# Patient Record
Sex: Male | Born: 1965 | Race: Black or African American | Hispanic: No | Marital: Single | Smoking: Current every day smoker
Health system: Southern US, Community
[De-identification: ages and names within clinical notes are randomized; demographics above are authoritative.]

## PROBLEM LIST (undated history)

## (undated) DIAGNOSIS — I10 Essential (primary) hypertension: Secondary | ICD-10-CM

## (undated) HISTORY — PX: KNEE ARTHROSCOPY: SHX127

---

## 2010-10-26 ENCOUNTER — Emergency Department (HOSPITAL_COMMUNITY)
Admission: EM | Admit: 2010-10-26 | Discharge: 2010-10-26 | Disposition: A | Discharge status: Home / Self Care | Payer: Self-pay | Attending: Emergency Medicine | Admitting: Emergency Medicine

## 2010-10-26 DIAGNOSIS — I10 Essential (primary) hypertension: Secondary | ICD-10-CM | POA: Insufficient documentation

## 2010-10-26 LAB — CBC
Hemoglobin: 14.5 g/dL (ref 13.0–17.0)
MCH: 32.6 pg (ref 26.0–34.0)
MCV: 92.4 fL (ref 78.0–100.0)
RBC: 4.45 MIL/uL (ref 4.22–5.81)

## 2010-10-26 LAB — COMPREHENSIVE METABOLIC PANEL
CO2: 27 mEq/L (ref 19–32)
Calcium: 9.3 mg/dL (ref 8.4–10.5)
Creatinine, Ser: 0.82 mg/dL (ref 0.50–1.35)
GFR calc Af Amer: >60 mL/min (ref >60)
GFR calc non Af Amer: >60 mL/min (ref >60)
Glucose, Bld: 102 mg/dL — ABNORMAL HIGH (ref 70–99)

## 2010-10-26 LAB — DIFFERENTIAL
Eosinophils Absolute: 0.3 10*3/uL (ref 0.0–0.7)
Lymphs Abs: 1.9 10*3/uL (ref 0.7–4.0)
Monocytes Absolute: 0.4 10*3/uL (ref 0.1–1.0)
Monocytes Relative: 10 % (ref 3–12)
Neutrophils Relative %: 40 % — ABNORMAL LOW (ref 43–77)

## 2010-10-30 ENCOUNTER — Inpatient Hospital Stay (INDEPENDENT_AMBULATORY_CARE_PROVIDER_SITE_OTHER)
Admission: RE | Admit: 2010-10-30 | Discharge: 2010-10-30 | Disposition: A | Discharge status: Home / Self Care | Payer: Self-pay | Source: Ambulatory Visit | Attending: Family Medicine | Admitting: Family Medicine

## 2010-10-30 DIAGNOSIS — I10 Essential (primary) hypertension: Secondary | ICD-10-CM

## 2011-06-01 ENCOUNTER — Emergency Department (HOSPITAL_COMMUNITY)
Admission: EM | Admit: 2011-06-01 | Discharge: 2011-06-01 | Disposition: A | Discharge status: Home / Self Care | Payer: BC Managed Care – PPO | Attending: Emergency Medicine | Admitting: Emergency Medicine

## 2011-06-01 ENCOUNTER — Encounter (HOSPITAL_COMMUNITY): Payer: Self-pay | Admitting: *Deleted

## 2011-06-01 DIAGNOSIS — R5381 Other malaise: Secondary | ICD-10-CM | POA: Insufficient documentation

## 2011-06-01 DIAGNOSIS — S01511A Laceration without foreign body of lip, initial encounter: Secondary | ICD-10-CM

## 2011-06-01 DIAGNOSIS — I10 Essential (primary) hypertension: Secondary | ICD-10-CM

## 2011-06-01 DIAGNOSIS — R55 Syncope and collapse: Secondary | ICD-10-CM

## 2011-06-01 DIAGNOSIS — Z7982 Long term (current) use of aspirin: Secondary | ICD-10-CM | POA: Insufficient documentation

## 2011-06-01 DIAGNOSIS — X58XXXA Exposure to other specified factors, initial encounter: Secondary | ICD-10-CM | POA: Insufficient documentation

## 2011-06-01 DIAGNOSIS — S01501A Unspecified open wound of lip, initial encounter: Secondary | ICD-10-CM | POA: Insufficient documentation

## 2011-06-01 DIAGNOSIS — Y9229 Other specified public building as the place of occurrence of the external cause: Secondary | ICD-10-CM | POA: Insufficient documentation

## 2011-06-01 DIAGNOSIS — R42 Dizziness and giddiness: Secondary | ICD-10-CM | POA: Insufficient documentation

## 2011-06-01 DIAGNOSIS — F17200 Nicotine dependence, unspecified, uncomplicated: Secondary | ICD-10-CM | POA: Insufficient documentation

## 2011-06-01 DIAGNOSIS — F1010 Alcohol abuse, uncomplicated: Secondary | ICD-10-CM | POA: Insufficient documentation

## 2011-06-01 HISTORY — DX: Essential (primary) hypertension: I10

## 2011-06-01 MED ORDER — AMOXICILLIN-POT CLAVULANATE 875-125 MG PO TABS: 1.0000 | ORAL_TABLET | Freq: Two times a day (BID) | ORAL | Status: DC

## 2011-06-01 MED ORDER — SODIUM CHLORIDE 0.9 % IV BOLUS (SEPSIS): 1000.0000 mL | Freq: Once | INTRAVENOUS | Status: DC

## 2011-06-01 MED ORDER — TETANUS-DIPHTH-ACELL PERTUSSIS 5-2.5-18.5 LF-MCG/0.5 IM SUSP
0.5000 mL | Freq: Once | INTRAMUSCULAR | Status: AC
  Administered 2011-06-01: 0.5 mL via INTRAMUSCULAR
  Filled 2011-06-01: qty 0.5

## 2011-06-01 NOTE — ED Notes (Signed)
Discharged with instructions NAD noted.

## 2011-06-01 NOTE — Discharge Instructions (Signed)
Follow up with your doctor, an urgent care, or this Emergency Department for removal of your stitches in 7 days. Do not submerge the stitches in water for the first 24 hours. Take your pain medication as prescribed. Do not operate heavy machinery while on pain medication. Note that your pain medication contains acetaminophen (Tylenol) & its is not reccommended that you use additional acetaminophen (Tylenol) while taking this medication. Read instructions below.  TREATMENT   Keep the wound clean and dry.   If you were given a bandage (dressing), you should change it at least once a day. Also, change the dressing if it becomes wet or dirty, or as directed by your caregiver.   Wash the wound with soap and water 2 times a day. Rinse the wound off with water to remove all soap. Pat the wound dry with a clean towel.   You may shower as usual after the first 24 hours. Do not soak the wound in water until the sutures are removed.   Once the wound has healed, scarring can be minimized by covering the wound with sunscreen during the day for 1 full year.Marland Kitchen   SEEK MEDICAL CARE IF:   You have redness, swelling, or increasing pain in the wound.   You see a red line that goes away from the wound.   You have yellowish-white fluid (pus) coming from the wound.   You have a fever.   You notice a bad smell coming from the wound or dressing.   Your wound breaks open before or after sutures have been removed.   You notice something coming out of the wound such as wood or glass.   Your wound is on your hand or foot and you cannot move a finger or toe.   Your pain is not controlled with prescribed medicine.   If you did not receive a tetanus shot today because you thought you were up to date, but did not recall when your last one was given, make sure to check with your primary caregiver to determine if you need one.  Human Bite Human bite wounds tend to become infected, even when they seem minor at  first. Bite wounds of the hand can be serious because the tendons and joints are close to the skin. Infection can develop very rapidly, even in a matter of hours.  DIAGNOSIS  Your caregiver will most likely:  Take a detailed history of the bite injury.   Perform a wound exam.   Take your medical history.  Blood tests or X-rays may be performed. Sometimes, infected bite wounds are cultured and sent to a lab to identify the infectious bacteria. TREATMENT  Medical treatment will depend on the location of the bite as well as the patient's medical history. Treatment may include:  Wound care, such as cleaning and flushing the wound with saline solution, bandaging, and elevating the affected area.   Antibiotic medicine.   Tetanus immunization.   Leaving the wound open to heal. This is often done with human bites due to the high risk of infection. However, in certain cases, wound closure with stitches, wound adhesive, skin adhesive strips, or staples may be used.  Infected bites that are left untreated may require intravenous (IV) antibiotics and surgical treatment in the hospital. HOME CARE INSTRUCTIONS  Follow your caregiver's instructions for wound care.   Take all medicines as directed.   If your caregiver prescribes antibiotics, take them as directed. Finish them even if you start to feel  better.   Follow up with your caregiver for further exams or immunizations as directed.  You may need a tetanus shot if:  You cannot remember when you had your last tetanus shot.   You have never had a tetanus shot.   The injury broke your skin.  If you get a tetanus shot, your arm may swell, get red, and feel warm to the touch. This is common and not a problem. If you need a tetanus shot and you choose not to have one, there is a rare chance of getting tetanus. Sickness from tetanus can be serious. SEEK IMMEDIATE MEDICAL CARE IF:  You have increased pain, swelling, or redness around the bite  wound.   You have chills.   You have a fever.   You have pus draining from the wound.   You have red streaks on the skin coming from the wound.   You have pain with movement or trouble moving the injured part.   You are not improving, or you are getting worse.   You have any other questions or concerns.  MAKE SURE YOU:  Understand these instructions.   Will watch your condition.   Will get help right away if you are not doing well or get worse.  Document Released: 03/14/2004 Document Revised: 01/24/2011 Document Reviewed: 09/26/2010 [facility] Patient Information 2012 [place], [place]. RESOURCE GUIDE  Dental Problems  Patients with Medicaid: [facility] [phone] [address]                                           [phone] [address] Phone:  [phone]                                                  Phone:  [phone]  If unable to pay or uninsured, contact:  [facility] or [facility]. to become qualified for the adult dental clinic.  Chronic Pain Problems Contact [facility]  [phone] Patients need to be referred by their primary care doctor.  Insufficient Money for Medicine Contact [employer]:  call "211" or [facility] (631)549-0835.  No Primary Care Doctor Call [facility]  ([phone] Other agencies that provide inexpensive medical care    [facility]  7800277404    [facility]  [phone]    [facility]  [phone]    [facility]  [phone]    [facility]  [phone]    [facility]  [phone]  Psychological Services [facility]  [phone] [facility]  [phone] [facility]   [phone] (emergency services 5627402001)  Substance Abuse [facility]  727-708-7626 [facility] 806-215-9740 [facility] [phone] Floyd[facility] Flock  ([phone] Residential & Outpatient Substance Abuse Program  437-458-6875  Abuse/Neglect [facility] Child Abuse Hotline (817)337-3254 [facility] Child Abuse Hotline 8380168779 (After Hours)  Emergency Shelter [facility] 336-730-7577  Maternity Homes [facility] [place] [facility] [phone] [facility] [phone]  MRSA Hotline #:  832-7006    [place] Resources  [facility] [place]     [employer]                          [place] [facility] [address] [place]                       [address]      [address]  [place]                                                [place]                            [place] Phone:  [phone]                                   Phone:  [phone]                 Phone:  [phone]  [place] [facility] Phone:  [phone]  [place] Child Abuse Hotline (336) 342-1394 ([phone] (After Hours)   

## 2011-06-01 NOTE — ED Notes (Signed)
Pt got excited & bit down on lower lip. Bleeding presently, currently holding pressure

## 2011-06-01 NOTE — ED Notes (Signed)
+   ETOH, reports drank 3 forties PTA

## 2011-06-01 NOTE — ED Provider Notes (Signed)
History     CSN: [id number]  Arrival date & time 06/01/11  1338   First MD Initiated Contact with Patient 06/01/11 1348      Chief Complaint  Patient presents with  . Weakness    (Consider location/radiation/quality/duration/timing/severity/associated sxs/prior treatment) Patient is a 46 y.o. male presenting with weakness. The history is provided by the patient.  Weakness The primary symptoms include dizziness. Primary symptoms do not include nausea or vomiting.  Dizziness also occurs with weakness. Dizziness does not occur with nausea or vomiting.  Additional symptoms include weakness.   patient was just seen in ER for alcohol intoxication and lip injury after he bit down. He went outside to smoke after being discharged and felt lightheaded and came back. Blood pressure was found to be 92/52. Patient states his he thinks it is because he lost some blood. He feels better now. No headache. No chest pain. No abdominal pain.  Past Medical History  Diagnosis Date  . Hypertension     History reviewed. No pertinent past surgical history.  No family history on file.  History  Substance Use Topics  . Smoking status: Current Everyday Smoker -- 0.5 packs/day    Types: Cigarettes  . Smokeless tobacco: Not on file  . Alcohol Use: Yes     multiple 40s a day      Review of Systems  HENT: Negative for sore throat and voice change.   Respiratory: Negative for shortness of breath.   Gastrointestinal: Negative for nausea, vomiting and abdominal pain.  Skin: Negative for pallor and rash.  Neurological: Positive for dizziness and weakness.    Allergies  Review of patient's allergies indicates not on file.  Home Medications   Current Outpatient Rx  Name Route Sig Dispense Refill  . AMOXICILLIN-POT CLAVULANATE 875-125 MG PO TABS Oral Take 1 tablet by mouth 2 (two) times daily. 10 tablet 0  . ASPIRIN 325 MG PO TABS Oral Take 325 mg by mouth daily.      BP 92/52  Pulse 76   Temp(Src) 99.6 F (37.6 C) (Oral)  Resp 16  SpO2 97%  Physical Exam  Constitutional: He is oriented to person, place, and time. He appears well-developed.  HENT:  Head: Normocephalic.       Swelling to right lower lip. Mild oozing of blood  Eyes: Conjunctivae are normal. Pupils are equal, round, and reactive to light.  Cardiovascular: Normal rate and regular rhythm.   Pulmonary/Chest: Effort normal. No respiratory distress.  Abdominal: Soft. There is no tenderness.  Musculoskeletal: Normal range of motion.  Neurological: He is alert and oriented to person, place, and time.       Patient spells of alcohol    ED Course  Procedures (including critical care time)  Labs Reviewed - No data to display No results found.   1. Near syncope       MDM  Patient with near syncope after leaving the ER. Appears intoxicated. No trauma from his dizziness. Blood pressure was initially low, but improved. He does not want any further evaluation and will be discharged home.        Juliet Rude. Rubin Payor, MD 06/01/11 1404

## 2011-06-01 NOTE — ED Provider Notes (Deleted)
  Physical Exam  BP 92/52  Pulse 76  Temp(Src) 99.6 F (37.6 C) (Oral)  Resp 16  SpO2 97%  Physical Exam  Patient is hypotensive. He does not appear dizzy. He was ambulatory here. Laceration to right lower lip. He smells of alcohol  ED Course  Procedures  MDM Patient was just discharged after a lip injury. He states he bit down. He was outside smoking a cigarette and became lightheaded and dizzy. No chest pain. Abdominal pain. No headache. Initial blood pressure is 92/52. He smells of alcohol. Patient given a fluid boluses and lab work and EKG will be checked at this time      Juliet Rude. Rubin Payor, MD 06/01/11 1352

## 2011-06-01 NOTE — ED Provider Notes (Signed)
History     CSN: [id number]  Arrival date & time 06/01/11  0935   First MD Initiated Contact with Patient 06/01/11 1012      Chief Complaint  Patient presents with  . Lip Laceration    (Consider location/radiation/quality/duration/timing/severity/associated sxs/prior treatment) HPI Comments: Patient presents emergency department with chief complaint of lip laceration.  Patient states that he was drinking with friends and having a good time when he got excited and bit down on his lower lip.  Patient's lip has been continuously bleeding ever sense.  911 was called and patient was transferred.  Patient denies any other symptoms including fever, lightheadedness, nausea, vomiting, syncope.  Patient has no other complaints at this time.  The history is provided by the patient.    Past Medical History  Diagnosis Date  . Hypertension     History reviewed. No pertinent past surgical history.  No family history on file.  History  Substance Use Topics  . Smoking status: Current Everyday Smoker -- 0.5 packs/day    Types: Cigarettes  . Smokeless tobacco: Not on file  . Alcohol Use:       Review of Systems  Constitutional: Negative for fever, chills and appetite change.  HENT: Negative for congestion.   Eyes: Negative for visual disturbance.  Respiratory: Negative for shortness of breath.   Cardiovascular: Negative for chest pain and leg swelling.  Gastrointestinal: Negative for abdominal pain.  Genitourinary: Negative for dysuria, urgency and frequency.  Skin: Positive for wound.  Neurological: Negative for dizziness, syncope, weakness, light-headedness, numbness and headaches.  Psychiatric/Behavioral: Negative for confusion.    Allergies  Review of patient's allergies indicates not on file.  Home Medications   Current Outpatient Rx  Name Route Sig Dispense Refill  . ASPIRIN 325 MG PO TABS Oral Take 325 mg by mouth daily.      BP 156/95  Pulse 80  Temp(Src) 97.8 F  (36.6 C) (Oral)  Resp 20  SpO2 98%  Physical Exam  Nursing note and vitals reviewed. Constitutional: He is oriented to person, place, and time. He appears well-developed and well-nourished. No distress.       Smells of ETOH  HENT:  Head: Normocephalic.       Jagged lip puncture, currently bleeding.   Eyes: Conjunctivae and EOM are normal.  Neck: Normal range of motion.  Pulmonary/Chest: Effort normal.  Musculoskeletal: Normal range of motion.  Neurological: He is alert and oriented to person, place, and time.  Skin: Skin is warm and dry. No rash noted. He is not diaphoretic.  Psychiatric: He has a normal mood and affect. His behavior is normal.    ED Course  Procedures (including critical care time)  Labs Reviewed - No data to display No results found.   No diagnosis found.  LACERATION REPAIR Performed by: Jaci Carrel Authorized by: Jaci Carrel Consent: Verbal consent obtained. Risks and benefits: risks, benefits and alternatives were discussed Consent given by: patient Patient identity confirmed: provided demographic data Prepped and Draped in normal sterile fashion Wound explored  Laceration Location: right sd lower lip  Laceration Length: .5cm  No Foreign Bodies seen or palpated  Anesthesia: local infiltration  Local anesthetic: lidocaine 1% no epinephrine  Anesthetic total: 1 ml  Irrigation method: syringe Amount of cleaning: standard  Skin closure: 4.0 gut  Number of sutures: 3  Technique: figure eight & simple interupted  Patient tolerance: Patient tolerated the procedure well with no immediate complications.   MDM  Lip laceration, Hypertension  Tdap booster given.Pressure irrigation performed. Laceration occurred < 8 hours prior to repair which was well tolerated. Pt has no co morbidities to effect normal wound healing. Discussed suture home care w pt and answered questions. Pt to f-u for wound check 5 days. Pt is hemodynamically stable w no  complaints prior to dc.  Advised to follow up with PCP for HTN          Jaci Carrel, PA-C 06/01/11 1135

## 2011-06-01 NOTE — Discharge Instructions (Signed)
Near-Syncope Near-syncope is sudden weakness, dizziness, or feeling like you might pass out (faint). This may occur when getting up after sitting or while standing for a long period of time. Near-syncope can be caused by a drop in blood pressure. This is a common reaction, but it may occur to a greater degree in people taking medicines to control their blood pressure. Fainting often occurs when the blood pressure or pulse is too low to provide enough blood flow to the brain to keep you conscious. Fainting and near-syncope are not usually due to serious medical problems. However, certain people should be more cautious in the event of near-syncope, including elderly patients, patients with diabetes, and patients with a history of heart conditions (especially irregular rhythms).  CAUSES   Drop in blood pressure.   Physical pain.   Dehydration.   Heat exhaustion.   Emotional distress.   Low blood sugar.   Internal bleeding.   Heart and circulatory problems.   Infections.  SYMPTOMS   Dizziness.   Feeling sick to your stomach (nauseous).   Nearly fainting.   Body numbness.   Turning pale.   Tunnel vision.   Weakness.  HOME CARE INSTRUCTIONS   Lie down right away if you start feeling like you might faint. Breathe deeply and steadily. Wait until all the symptoms have passed. Most of these episodes last only a few minutes. You may feel tired for several hours.   Drink enough fluids to keep your urine clear or pale yellow.   If you are taking blood pressure or heart medicine, get up slowly, taking several minutes to sit and then stand. This can reduce dizziness that is caused by a drop in blood pressure.  SEEK IMMEDIATE MEDICAL CARE IF:   You have a severe headache.   Unusual pain develops in the chest, abdomen, or back.   There is bleeding from the mouth or rectum, or you have black or tarry stool.   An irregular heartbeat or a very rapid pulse develops.   You have  repeated fainting or seizure-like jerking during an episode.   You faint when sitting or lying down.   You develop confusion.   You have difficulty walking.   Severe weakness develops.   Vision problems develop.  MAKE SURE YOU:   Understand these instructions.   Will watch your condition.   Will get help right away if you are not doing well or get worse.  Document Released: 02/04/2005 Document Revised: 01/24/2011 Document Reviewed: 03/23/2010 [employer] Patient Information 2012 [employer]. 

## 2011-06-01 NOTE — ED Notes (Signed)
PT was observed out side on side walk just after his D/C from emergency room. Pt now reports he is weak . B/P is 92/52. Pt is A/O . Pt reports Lac on lower lip was present on recent ADM.

## 2011-06-04 NOTE — ED Provider Notes (Signed)
Medical screening examination/treatment/procedure(s) were performed by non-physician practitioner and as supervising physician I was immediately available for consultation/collaboration.   Jordyn Doane, MD 06/04/11 0549 

## 2011-06-07 ENCOUNTER — Encounter (HOSPITAL_COMMUNITY): Payer: Self-pay | Admitting: Physical Medicine and Rehabilitation

## 2011-06-07 ENCOUNTER — Emergency Department (HOSPITAL_COMMUNITY)
Admission: EM | Admit: 2011-06-07 | Discharge: 2011-06-07 | Disposition: A | Discharge status: Home / Self Care | Payer: BC Managed Care – PPO | Attending: Emergency Medicine | Admitting: Emergency Medicine

## 2011-06-07 DIAGNOSIS — I10 Essential (primary) hypertension: Secondary | ICD-10-CM | POA: Insufficient documentation

## 2011-06-07 DIAGNOSIS — F17200 Nicotine dependence, unspecified, uncomplicated: Secondary | ICD-10-CM | POA: Insufficient documentation

## 2011-06-07 DIAGNOSIS — Z4802 Encounter for removal of sutures: Secondary | ICD-10-CM | POA: Insufficient documentation

## 2011-06-07 NOTE — ED Provider Notes (Signed)
Medical screening examination/treatment/procedure(s) were performed by non-physician practitioner and as supervising physician I was immediately available for consultation/collaboration.  Jewelz Ricklefs, MD 06/07/11 1608 

## 2011-06-07 NOTE — ED Provider Notes (Signed)
History     CSN: [id number]  Arrival date & time 06/07/11  7829   First MD Initiated Contact with Patient 06/07/11 1018      Chief Complaint  Patient presents with  . Suture / Staple Removal    (Consider location/radiation/quality/duration/timing/severity/associated sxs/prior treatment) HPI Patient presents to the ER for re-evaluation of a lower lip wound and suture removal. The patient has had no complications since the wound was closed. The patient states that there have been no signs of infection. The patient is here for suture removal. Past Medical History  Diagnosis Date  . Hypertension     No past surgical history on file.  History reviewed. No pertinent family history.  History  Substance Use Topics  . Smoking status: Current Everyday Smoker -- 0.5 packs/day    Types: Cigarettes  . Smokeless tobacco: Not on file  . Alcohol Use: Yes     multiple 40s a day      Review of Systems All pertinent positives and negatives reviewed in the history of present illness  Allergies  Review of patient's allergies indicates no known allergies.  Home Medications   Current Outpatient Rx  Name Route Sig Dispense Refill  . AMOXICILLIN-POT CLAVULANATE 875-125 MG PO TABS Oral Take 1 tablet by mouth 2 (two) times daily. Started 4/16 for 10 days    . ASPIRIN 325 MG PO TABS Oral Take 325 mg by mouth as needed. For pain      BP 174/115  Pulse 80  Temp(Src) 98.1 F (36.7 C) (Oral)  Resp 18  SpO2 99%  Physical Exam  Nursing note and vitals reviewed. Constitutional: He appears well-developed and well-nourished.  HENT:       The patient has a healing laceration to his R lower lip. There is no purulent drainage or swelling.  Small piece of gut suture is exposed outside the wound   Eyes: Pupils are equal, round, and reactive to light.  Cardiovascular: Normal rate.   Pulmonary/Chest: Effort normal and breath sounds normal.  Skin: Skin is warm and dry.    ED Course    Procedures (including critical care time)  There is a small end of a gut suture out of the wound that I trimmed down.   MDM          Carlyle Dolly, PA-C 06/07/11 1105

## 2011-06-07 NOTE — ED Notes (Signed)
Pt d/c home in NAD. Pt voiced understanding of d/c instructions. Pt denies any pain at this time.

## 2011-06-07 NOTE — ED Notes (Signed)
Pt presents to department for evaluation of suture removal. States he had sutures placed on Friday to R lower lip. Here for follow up and removal. Site is yellow in color. Pt denies pain at the time. Has been taking antibiotics as prescribed. No signs of distress noted at the time.

## 2011-06-07 NOTE — Discharge Instructions (Signed)
Keep area clean and dry. Return here for any worsening in the area.

## 2011-10-04 ENCOUNTER — Encounter (HOSPITAL_COMMUNITY): Payer: Self-pay | Admitting: *Deleted

## 2011-10-04 ENCOUNTER — Emergency Department (HOSPITAL_COMMUNITY)
Admission: EM | Admit: 2011-10-04 | Discharge: 2011-10-04 | Disposition: A | Discharge status: Home / Self Care | Payer: BC Managed Care – PPO | Attending: Emergency Medicine | Admitting: Emergency Medicine

## 2011-10-04 DIAGNOSIS — L0390 Cellulitis, unspecified: Secondary | ICD-10-CM

## 2011-10-04 DIAGNOSIS — IMO0002 Reserved for concepts with insufficient information to code with codable children: Secondary | ICD-10-CM | POA: Insufficient documentation

## 2011-10-04 DIAGNOSIS — F17200 Nicotine dependence, unspecified, uncomplicated: Secondary | ICD-10-CM | POA: Insufficient documentation

## 2011-10-04 DIAGNOSIS — I10 Essential (primary) hypertension: Secondary | ICD-10-CM | POA: Insufficient documentation

## 2011-10-04 MED ORDER — HYDROCODONE-ACETAMINOPHEN 5-325 MG PO TABS: 1.0000 | ORAL_TABLET | Freq: Four times a day (QID) | ORAL | Status: DC | PRN

## 2011-10-04 NOTE — ED Provider Notes (Signed)
History     CSN: [id number]  Arrival date & time 10/04/11  7829   First MD Initiated Contact with Patient 10/04/11 [phone]      Chief Complaint  Patient presents with  . Abscess    (Consider location/radiation/quality/duration/timing/severity/associated sxs/prior treatment) Patient is a 46 y.o. male presenting with abscess. The history is provided by the patient.  Abscess  This is a new problem. The current episode started less than one week ago. The onset was gradual. The problem occurs rarely. The problem has been gradually worsening. The abscess is present on the left arm. The problem is moderate. The abscess is characterized by redness, painfulness and swelling. It is unknown what he was exposed to. The abscess first occurred at home. Pertinent negatives include no anorexia, no decrease in physical activity, not sleeping less, not drinking less, no fever, no fussiness, not sleeping more, no diarrhea, no vomiting, no congestion, no rhinorrhea, no sore throat, no decreased responsiveness and no cough. His past medical history does not include skin abscesses in family. There were no sick contacts. He has received no recent medical care.    Past Medical History  Diagnosis Date  . Hypertension     History reviewed. No pertinent past surgical history.  History reviewed. No pertinent family history.  History  Substance Use Topics  . Smoking status: Current Everyday Smoker -- 0.5 packs/day    Types: Cigarettes  . Smokeless tobacco: Not on file  . Alcohol Use: Yes     multiple 40s a day      Review of Systems  Constitutional: Positive for chills. Negative for fever, diaphoresis, activity change and decreased responsiveness.  HENT: Negative for congestion, sore throat, rhinorrhea and neck pain.   Respiratory: Negative for cough.   Gastrointestinal: Negative for vomiting, diarrhea and anorexia.  Genitourinary: Negative for dysuria.  Musculoskeletal: Negative for myalgias.  Skin:  Positive for color change (abcess w surrounding erythema). Negative for wound.  Neurological: Negative for headaches.  All other systems reviewed and are negative.    Allergies  Review of patient's allergies indicates no known allergies.  Home Medications   Current Outpatient Rx  Name Route Sig Dispense Refill  . ASPIRIN 325 MG PO TABS Oral Take 325 mg by mouth as needed. For pain      BP 196/133  Pulse 92  Temp 98.4 F (36.9 C) (Oral)  Resp 20  SpO2 96%  Physical Exam  Nursing note and vitals reviewed. Constitutional: He is oriented to person, place, and time. He appears well-developed and well-nourished. He does not have a sickly appearance. He does not appear ill. No distress.  HENT:  Head: Normocephalic and atraumatic.  Eyes: Conjunctivae and EOM are normal.  Neck: Normal range of motion. Neck supple.  Cardiovascular: Normal rate and regular rhythm.   Pulmonary/Chest: Effort normal and breath sounds normal.  Musculoskeletal: He exhibits no edema.  Lymphadenopathy:       Head (right side): No submental, no preauricular and no posterior auricular adenopathy present.       Head (left side): No submental, no submandibular, no preauricular and no posterior auricular adenopathy present.    He has no axillary adenopathy.  Neurological: He is alert and oriented to person, place, and time.  Skin: Skin is warm and dry. No rash noted. He is not diaphoretic.       3cm sized abscess located on left upper anterior arm. Extreme tenderness to palpation. Not currently draining. Abscess is fluctuant with  surrounding erythema.  No induration.    ED Course  Procedures (including critical care time)  Labs Reviewed - No data to display No results found.   No diagnosis found.  INCISION AND DRAINAGE Performed by: Jaci Carrel Consent: Verbal consent obtained. Risks and benefits: risks, benefits and alternatives were discussed Type: abscess  Body area: left anterior upper arm    Anesthesia: local infiltration  Local anesthetic: lidocaine 2% w epinephrine  Anesthetic total: 2 ml  Complexity: complex Blunt dissection to break up loculations  Drainage: purulent  Drainage amount: moderate  Packing material: 1/4 in iodoform gauze  Patient tolerance: Patient tolerated the procedure well with no immediate complications.     MDM  Abscess Patient with skin abscess amenable to incision and drainage.  Abscess was large enough to warrant packing with removal and wound recheck in 2 days. No signs of cellulitis is surrounding skin.  Will d/c to home.  No antibiotic therapy is indicated.Jaci Carrel, [place] 10/04/11 [phone]

## 2011-10-04 NOTE — ED Notes (Addendum)
Pt with abscess to left lateral upper arm noted on Wednesday, reports chills this am.

## 2011-10-04 NOTE — ED Notes (Signed)
Discharged home with written and verbal instructions.  No questions or concern at discharge 

## 2011-10-04 NOTE — ED Provider Notes (Signed)
Medical screening examination/treatment/procedure(s) were performed by non-physician practitioner and as supervising physician I was immediately available for consultation/collaboration.   Dorrell Mitcheltree, MD 10/04/11 1523 

## 2011-10-06 ENCOUNTER — Encounter (HOSPITAL_COMMUNITY): Payer: Self-pay | Admitting: *Deleted

## 2011-10-06 ENCOUNTER — Emergency Department (HOSPITAL_COMMUNITY)
Admission: EM | Admit: 2011-10-06 | Discharge: 2011-10-06 | Disposition: A | Discharge status: Home / Self Care | Payer: BC Managed Care – PPO | Attending: Emergency Medicine | Admitting: Emergency Medicine

## 2011-10-06 DIAGNOSIS — IMO0002 Reserved for concepts with insufficient information to code with codable children: Secondary | ICD-10-CM | POA: Insufficient documentation

## 2011-10-06 DIAGNOSIS — I10 Essential (primary) hypertension: Secondary | ICD-10-CM | POA: Insufficient documentation

## 2011-10-06 DIAGNOSIS — L0291 Cutaneous abscess, unspecified: Secondary | ICD-10-CM

## 2011-10-06 DIAGNOSIS — L0390 Cellulitis, unspecified: Secondary | ICD-10-CM

## 2011-10-06 DIAGNOSIS — F17200 Nicotine dependence, unspecified, uncomplicated: Secondary | ICD-10-CM | POA: Insufficient documentation

## 2011-10-06 MED ORDER — CLINDAMYCIN HCL 150 MG PO CAPS: ORAL_CAPSULE | ORAL | Status: DC

## 2011-10-06 NOTE — ED Notes (Signed)
Seen here Friday for abscess to upper left arm. Reports has packing in it

## 2011-10-06 NOTE — ED Provider Notes (Signed)
History     CSN: [id number]  Arrival date & time 10/06/11  7829   First MD Initiated Contact with Patient 10/06/11 [phone]      Chief Complaint  Patient presents with  . Wound Check    (Consider location/radiation/quality/duration/timing/severity/associated sxs/prior treatment) HPI Patient presents for wound recheck of an abscess that was drained 2 days ago.  Patient has surrounding redness to the area.  Patient states the area is improving, but there still pain, and redness. Past Medical History  Diagnosis Date  . Hypertension     Past Surgical History  Procedure Date  . Knee arthroscopy     No family history on file.  History  Substance Use Topics  . Smoking status: Current Everyday Smoker -- 0.5 packs/day    Types: Cigarettes  . Smokeless tobacco: Not on file  . Alcohol Use: Yes     multiple 40s a day      Review of Systems All other systems negative except as documented in the HPI. All pertinent positives and negatives as reviewed in the HPI.  Allergies  Review of patient's allergies indicates no known allergies.  Home Medications   Current Outpatient Rx  Name Route Sig Dispense Refill  . ASPIRIN 325 MG PO TABS Oral Take 325 mg by mouth as needed. For pain    . HYDROCODONE-ACETAMINOPHEN 5-325 MG PO TABS Oral Take 1 tablet by mouth every 6 (six) hours as needed. For pain      BP 187/107  Pulse 75  Temp 97.9 F (36.6 C) (Oral)  Resp 16  SpO2 98%  Physical Exam  Constitutional: He appears well-developed and well-nourished. No distress.  Skin:       ED Course  Procedures (including critical care time)  Patient be placed on by mouth antibiotics mass return in 2 days for recheck.  Told to use warm compresses around the area.   MDM          Carlyle Dolly, PA-C 10/06/11 1045  Carlyle Dolly, PA-C 10/06/11 1045

## 2011-10-06 NOTE — ED Provider Notes (Signed)
Medical screening examination/treatment/procedure(s) were conducted as a shared visit with non-physician practitioner(s) and myself.  I personally evaluated the patient during the encounter  Toy Baker, MD 10/06/11 1015

## 2011-10-06 NOTE — ED Notes (Signed)
Drsg removed, packing remains intact

## 2011-10-07 NOTE — ED Provider Notes (Signed)
Medical screening examination/treatment/procedure(s) were conducted as a shared visit with non-physician practitioner(s) and myself.  I personally evaluated the patient during the encounter  Toy Baker, MD 10/07/11 [phone]

## 2011-10-08 ENCOUNTER — Encounter (HOSPITAL_COMMUNITY): Payer: Self-pay | Admitting: Emergency Medicine

## 2011-10-08 ENCOUNTER — Emergency Department (HOSPITAL_COMMUNITY)
Admission: EM | Admit: 2011-10-08 | Discharge: 2011-10-08 | Disposition: A | Discharge status: Home / Self Care | Payer: BC Managed Care – PPO | Attending: Emergency Medicine | Admitting: Emergency Medicine

## 2011-10-08 DIAGNOSIS — I10 Essential (primary) hypertension: Secondary | ICD-10-CM | POA: Insufficient documentation

## 2011-10-08 DIAGNOSIS — F17200 Nicotine dependence, unspecified, uncomplicated: Secondary | ICD-10-CM | POA: Insufficient documentation

## 2011-10-08 DIAGNOSIS — Z4801 Encounter for change or removal of surgical wound dressing: Secondary | ICD-10-CM | POA: Insufficient documentation

## 2011-10-08 DIAGNOSIS — Z5189 Encounter for other specified aftercare: Secondary | ICD-10-CM

## 2011-10-08 MED ORDER — IBUPROFEN 800 MG PO TABS: 800.0000 mg | ORAL_TABLET | Freq: Three times a day (TID) | ORAL | Status: AC

## 2011-10-08 NOTE — ED Provider Notes (Signed)
History     CSN: [id number]  Arrival date & time 10/08/11  7829   First MD Initiated Contact with Patient 10/08/11 [phone]      Chief Complaint  Patient presents with  . Wound Check  . Hypertension    (Consider location/radiation/quality/duration/timing/severity/associated sxs/prior treatment) HPI Comments: Patient presents for a wound recheck. He returned 2 days ago for wound recheck which showed a worsening localized infection of previously drained abscess. He was prescribed clindamycin and instructed to return for a follow up. Today he reports improvement of the wound after receiving antibiotics. He denies fever, headche, abdominal pain, NVD.   Patient is a 46 y.o. male presenting with wound check and hypertension.  Wound Check   Hypertension Pertinent negatives include no abdominal pain, chest pain, diaphoresis, fatigue, fever, nausea, numbness or vomiting.    Past Medical History  Diagnosis Date  . Hypertension     Past Surgical History  Procedure Date  . Knee arthroscopy     Family History  Problem Relation Age of Onset  . Hypertension Mother   . Diabetes Mother   . Hypertension Sister     History  Substance Use Topics  . Smoking status: Current Everyday Smoker -- 1.0 packs/day    Types: Cigarettes  . Smokeless tobacco: Not on file  . Alcohol Use: Yes     multiple 40s a day      Review of Systems  Constitutional: Negative for fever, diaphoresis and fatigue.  Respiratory: Negative for shortness of breath.   Cardiovascular: Negative for chest pain.  Gastrointestinal: Negative for nausea, vomiting, abdominal pain and diarrhea.  Skin: Positive for wound.  Neurological: Negative for dizziness, light-headedness and numbness.    Allergies  Review of patient's allergies indicates no known allergies.  Home Medications   Current Outpatient Rx  Name Route Sig Dispense Refill  . ASPIRIN 325 MG PO TABS Oral Take 325 mg by mouth as needed. For pain    .  CLINDAMYCIN HCL 150 MG PO CAPS  150 mg. for 10 days. Started on 10/06/11    . HYDROCODONE-ACETAMINOPHEN 5-325 MG PO TABS Oral Take 1 tablet by mouth every 6 (six) hours as needed. For pain      BP 208/121  Pulse 64  Temp 97 F (36.1 C) (Oral)  Resp 18  Ht 6' 2.5" (1.892 m)  Wt 182 lb (82.555 kg)  BMI 23.05 kg/m2  SpO2 99%  Physical Exam  Nursing note and vitals reviewed. Constitutional: He is oriented to person, place, and time. He appears well-developed and well-nourished. No distress.  HENT:  Head: Normocephalic and atraumatic.  Eyes: Conjunctivae are normal. No scleral icterus.  Neck: Normal range of motion.  Cardiovascular: Normal rate and regular rhythm.  Exam reveals no gallop and no friction rub.   No murmur heard. Pulmonary/Chest: Effort normal. No respiratory distress. He has no wheezes. He has no rales.  Musculoskeletal: Normal range of motion.       Evidence of drained abscess of left upper arm just proximal to the antecubital area. Mild erythema and edema but patient reports improvement. Mild tenderness to palpation.   Neurological: He is alert and oriented to person, place, and time.  Skin: Skin is warm. He is not diaphoretic.  Psychiatric: He has a normal mood and affect. His behavior is normal.    ED Course  Procedures (including critical care time)  Labs Reviewed - No data to display No results found.   No diagnosis found.    MDM  9:46 AM Packing removed and wound appears to be healing. Patient will finish antibiotics and return to work next week. I re-wrapped the wound. Dr. Deretha Emory saw the patient and agrees with the plan regarding the wound. Regarding his hypertension, the patient has an appt with his PCP this week to address his blood pressure. He can be discharged without further evaluation.         Emilia Beck, [place] 10/08/11 [phone]

## 2011-10-08 NOTE — ED Provider Notes (Signed)
Medical screening examination/treatment/procedure(s) were conducted as a shared visit with non-physician practitioner(s) and myself.  I personally evaluated the patient during the encounter  The patient seen by me the left antecubital abscess is healing re\re packing not required cellulitis has resolved. Recommend patient continue the clindamycin.  Additional problem is his hypertension reviewed his last several visits going all the way back to April is been hypertensive every time. Not sure if patient has a primary care Dr. for treatment of the high blood pressure or if he does not we will go ahead and control basic labs and start her regiment for him here today.  Shelda Jakes, MD 10/08/11 [phone]

## 2011-10-08 NOTE — ED Notes (Signed)
Patient claims came in Friday morning for a L arm abscess.  Patient claims was I & D'd then packed.  Patient claims no antibiotics were issued Friday and when he came back in for a recheck on Sunday, it had worsened.  Patient back today to have rechecked and to have packing removed.

## 2011-10-08 NOTE — ED Notes (Signed)
Patient's SBP > 200 at present.   Patient acuity adjusted to meed HTN needs at this time.

## 2011-10-09 NOTE — ED Provider Notes (Signed)
Medical screening examination/treatment/procedure(s) were conducted as a shared visit with non-physician practitioner(s) and myself.  I personally evaluated the patient during the encounter  Shelda Jakes, MD 10/09/11 2013

## 2012-12-26 ENCOUNTER — Emergency Department (HOSPITAL_COMMUNITY): Payer: BC Managed Care – PPO

## 2012-12-26 ENCOUNTER — Emergency Department (HOSPITAL_COMMUNITY)
Admission: EM | Admit: 2012-12-26 | Discharge: 2012-12-27 | Disposition: A | Discharge status: Home / Self Care | Payer: BC Managed Care – PPO | Attending: Emergency Medicine | Admitting: Emergency Medicine

## 2012-12-26 DIAGNOSIS — W19XXXA Unspecified fall, initial encounter: Secondary | ICD-10-CM | POA: Insufficient documentation

## 2012-12-26 DIAGNOSIS — S01501A Unspecified open wound of lip, initial encounter: Secondary | ICD-10-CM | POA: Insufficient documentation

## 2012-12-26 DIAGNOSIS — Z7982 Long term (current) use of aspirin: Secondary | ICD-10-CM | POA: Insufficient documentation

## 2012-12-26 DIAGNOSIS — I10 Essential (primary) hypertension: Secondary | ICD-10-CM | POA: Insufficient documentation

## 2012-12-26 DIAGNOSIS — Z23 Encounter for immunization: Secondary | ICD-10-CM | POA: Insufficient documentation

## 2012-12-26 DIAGNOSIS — F1010 Alcohol abuse, uncomplicated: Secondary | ICD-10-CM | POA: Insufficient documentation

## 2012-12-26 DIAGNOSIS — IMO0002 Reserved for concepts with insufficient information to code with codable children: Secondary | ICD-10-CM | POA: Insufficient documentation

## 2012-12-26 DIAGNOSIS — F17200 Nicotine dependence, unspecified, uncomplicated: Secondary | ICD-10-CM | POA: Insufficient documentation

## 2012-12-26 DIAGNOSIS — S0081XA Abrasion of other part of head, initial encounter: Secondary | ICD-10-CM

## 2012-12-26 DIAGNOSIS — F10929 Alcohol use, unspecified with intoxication, unspecified: Secondary | ICD-10-CM

## 2012-12-26 DIAGNOSIS — S01511A Laceration without foreign body of lip, initial encounter: Secondary | ICD-10-CM

## 2012-12-26 DIAGNOSIS — Y939 Activity, unspecified: Secondary | ICD-10-CM | POA: Insufficient documentation

## 2012-12-26 DIAGNOSIS — R4182 Altered mental status, unspecified: Secondary | ICD-10-CM

## 2012-12-26 DIAGNOSIS — Y9289 Other specified places as the place of occurrence of the external cause: Secondary | ICD-10-CM | POA: Insufficient documentation

## 2012-12-26 LAB — GLUCOSE, CAPILLARY: Glucose-Capillary: 121 mg/dL — ABNORMAL HIGH (ref 70–99)

## 2012-12-26 MED ORDER — LORAZEPAM 2 MG/ML IJ SOLN
2.0000 mg | Freq: Once | INTRAMUSCULAR | Status: AC
  Administered 2012-12-26: 2 mg via INTRAMUSCULAR
  Filled 2012-12-26: qty 1

## 2012-12-26 MED ORDER — THIAMINE HCL 100 MG/ML IJ SOLN
100.0000 mg | Freq: Once | INTRAMUSCULAR | Status: AC
  Administered 2012-12-27: 100 mg via INTRAVENOUS
  Filled 2012-12-26: qty 2

## 2012-12-26 MED ORDER — TETANUS-DIPHTHERIA TOXOIDS TD 5-2 LFU IM INJ
0.5000 mL | INJECTION | Freq: Once | INTRAMUSCULAR | Status: DC
  Filled 2012-12-26: qty 0.5

## 2012-12-26 MED ORDER — SODIUM CHLORIDE 0.9 % IV BOLUS (SEPSIS)
1000.0000 mL | Freq: Once | INTRAVENOUS | Status: AC
  Administered 2012-12-26: 1000 mL via INTRAVENOUS
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
## 2012-12-27 ENCOUNTER — Emergency Department (HOSPITAL_COMMUNITY): Payer: BC Managed Care – PPO

## 2012-12-27 ENCOUNTER — Encounter (HOSPITAL_COMMUNITY): Payer: Self-pay | Admitting: Emergency Medicine

## 2012-12-27 LAB — ETHANOL: Alcohol, Ethyl (B): 487 mg/dL (ref 0–11)

## 2012-12-27 LAB — URINALYSIS, ROUTINE W REFLEX MICROSCOPIC
Bilirubin Urine: NEGATIVE
Ketones, ur: NEGATIVE mg/dL
Leukocytes, UA: NEGATIVE
Nitrite: NEGATIVE
Protein, ur: NEGATIVE mg/dL
Urobilinogen, UA: 0.2 mg/dL (ref 0.0–1.0)

## 2012-12-27 LAB — COMPREHENSIVE METABOLIC PANEL
Albumin: 4.7 g/dL (ref 3.5–5.2)
Alkaline Phosphatase: 81 U/L (ref 39–117)
BUN: 6 mg/dL (ref 6–23)
Chloride: 102 mEq/L (ref 96–112)
Glucose, Bld: 124 mg/dL — ABNORMAL HIGH (ref 70–99)
Potassium: 4.1 mEq/L (ref 3.5–5.1)
Total Bilirubin: 0.2 mg/dL — ABNORMAL LOW (ref 0.3–1.2)

## 2012-12-27 LAB — CBC WITH DIFFERENTIAL/PLATELET
Basophils Relative: 0 % (ref 0–1)
Hemoglobin: 15.6 g/dL (ref 13.0–17.0)
Lymphs Abs: 2.5 10*3/uL (ref 0.7–4.0)
Monocytes Relative: 9 % (ref 3–12)
Neutro Abs: 1.7 10*3/uL (ref 1.7–7.7)
Neutrophils Relative %: 36 % — ABNORMAL LOW (ref 43–77)
RBC: 4.57 MIL/uL (ref 4.22–5.81)
WBC: 4.7 10*3/uL (ref 4.0–10.5)

## 2012-12-27 LAB — URINE MICROSCOPIC-ADD ON

## 2012-12-27 LAB — RAPID URINE DRUG SCREEN, HOSP PERFORMED: Tetrahydrocannabinol: NOT DETECTED

## 2012-12-27 MED ORDER — SODIUM CHLORIDE 0.9 % IV BOLUS (SEPSIS)
1000.0000 mL | Freq: Once | INTRAVENOUS | Status: AC
  Administered 2012-12-27: 1000 mL via INTRAVENOUS

## 2012-12-27 MED ORDER — TRAMADOL HCL 50 MG PO TABS: 50.0000 mg | ORAL_TABLET | Freq: Four times a day (QID) | ORAL | Status: DC | PRN

## 2012-12-27 MED ORDER — TETANUS-DIPHTH-ACELL PERTUSSIS 5-2.5-18.5 LF-MCG/0.5 IM SUSP
0.5000 mL | Freq: Once | INTRAMUSCULAR | Status: AC
  Administered 2012-12-27: 0.5 mL via INTRAMUSCULAR

## 2012-12-27 MED ORDER — BACITRACIN ZINC 500 UNIT/GM EX OINT: 1.0000 "application " | TOPICAL_OINTMENT | Freq: Two times a day (BID) | CUTANEOUS | Status: DC

## 2012-12-27 MED ORDER — IBUPROFEN 600 MG PO TABS: 600.0000 mg | ORAL_TABLET | Freq: Four times a day (QID) | ORAL | Status: DC | PRN

## 2012-12-27 NOTE — ED Notes (Signed)
Patient continues aggressive behaviors requiring 3 staff members and Engineer, materials to maintain control/safety. Patient placed in 4-point restraints at this time. Patient informed of why we are placing him in restraints and education provided regarding provisions for release. Patient continues to threaten staff and make aggressive movements.
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
## 2012-12-27 NOTE — ED Notes (Signed)
Patient placed on 2L via [place]. O2 Sats recovered.

## 2012-12-27 NOTE — ED Notes (Signed)
Patient arousible and oriented. Does not recall events of last night, unable to provide further history. Currently states only need is warmth. Blankets applied, room warmed. Denies pain. Monitoring continues.

## 2013-05-20 ENCOUNTER — Encounter (HOSPITAL_COMMUNITY): Payer: Self-pay | Admitting: Emergency Medicine

## 2013-05-20 ENCOUNTER — Emergency Department (HOSPITAL_COMMUNITY)
Admission: EM | Admit: 2013-05-20 | Discharge: 2013-05-20 | Disposition: A | Discharge status: Home / Self Care | Payer: BC Managed Care – PPO | Attending: Emergency Medicine | Admitting: Emergency Medicine

## 2013-05-20 DIAGNOSIS — K6289 Other specified diseases of anus and rectum: Secondary | ICD-10-CM

## 2013-05-20 DIAGNOSIS — K644 Residual hemorrhoidal skin tags: Secondary | ICD-10-CM

## 2013-05-20 DIAGNOSIS — F17200 Nicotine dependence, unspecified, uncomplicated: Secondary | ICD-10-CM | POA: Insufficient documentation

## 2013-05-20 DIAGNOSIS — I10 Essential (primary) hypertension: Secondary | ICD-10-CM | POA: Insufficient documentation

## 2013-05-20 LAB — POC OCCULT BLOOD, ED: FECAL OCCULT BLD: NEGATIVE

## 2013-05-20 MED ORDER — HYDROCORTISONE ACETATE 25 MG RE SUPP: 25.0000 mg | Freq: Two times a day (BID) | RECTAL | Status: DC

## 2013-05-20 MED ORDER — LIDOCAINE HCL 2 % EX GEL
Freq: Once | CUTANEOUS | Status: AC
  Administered 2013-05-20: 20
  Filled 2013-05-20: qty 20

## 2013-05-20 NOTE — Discharge Instructions (Signed)
Hemorrhoids Hemorrhoids are swollen veins around the rectum or anus. There are two types of hemorrhoids:   Internal hemorrhoids. These occur in the veins just inside the rectum. They may poke through to the outside and become irritated and painful.  External hemorrhoids. These occur in the veins outside the anus and can be felt as a painful swelling or hard lump near the anus. CAUSES  Pregnancy.   Obesity.   Constipation or diarrhea.   Straining to have a bowel movement.   Sitting for long periods on the toilet.  Heavy lifting or other activity that caused you to strain.  Anal intercourse. SYMPTOMS   Pain.   Anal itching or irritation.   Rectal bleeding.   Fecal leakage.   Anal swelling.   One or more lumps around the anus.  DIAGNOSIS  Your caregiver may be able to diagnose hemorrhoids by visual examination. Other examinations or tests that may be performed include:   Examination of the rectal area with a gloved hand (digital rectal exam).   Examination of anal canal using a small tube (scope).   A blood test if you have lost a significant amount of blood.  A test to look inside the colon (sigmoidoscopy or colonoscopy). TREATMENT Most hemorrhoids can be treated at home. However, if symptoms do not seem to be getting better or if you have a lot of rectal bleeding, your caregiver may perform a procedure to help make the hemorrhoids get smaller or remove them completely. Possible treatments include:   Placing a rubber band at the base of the hemorrhoid to cut off the circulation (rubber band ligation).   Injecting a chemical to shrink the hemorrhoid (sclerotherapy).   Using a tool to burn the hemorrhoid (infrared light therapy).   Surgically removing the hemorrhoid (hemorrhoidectomy).   Stapling the hemorrhoid to block blood flow to the tissue (hemorrhoid stapling).  HOME CARE INSTRUCTIONS   Eat foods with fiber, such as whole grains, beans,  nuts, fruits, and vegetables. Ask your doctor about taking products with added fiber in them (fibersupplements).  Increase fluid intake. Drink enough water and fluids to keep your urine clear or pale yellow.   Exercise regularly.   Go to the bathroom when you have the urge to have a bowel movement. Do not wait.   Avoid straining to have bowel movements.   Keep the anal area dry and clean. Use wet toilet paper or moist towelettes after a bowel movement.   Medicated creams and suppositories may be used or applied as directed.   Only take over-the-counter or prescription medicines as directed by your caregiver.   Take warm sitz baths for 15 20 minutes, 3 4 times a day to ease pain and discomfort.   Place ice packs on the hemorrhoids if they are tender and swollen. Using ice packs between sitz baths may be helpful.   Put ice in a plastic bag.   Place a towel between your skin and the bag.   Leave the ice on for 15 20 minutes, 3 4 times a day.   Do not use a donut-shaped pillow or sit on the toilet for long periods. This increases blood pooling and pain.  SEEK MEDICAL CARE IF:  You have increasing pain and swelling that is not controlled by treatment or medicine.  You have uncontrolled bleeding.  You have difficulty or you are unable to have a bowel movement.  You have pain or inflammation outside the area of the hemorrhoids. MAKE SURE YOU:    Understand these instructions.  Will watch your condition.  Will get help right away if you are not doing well or get worse. Document Released: 02/02/2000 Document Revised: 01/22/2012 Document Reviewed: 12/10/2011 Exit[employer] Patient Information 2014 [employer].  

## 2013-05-20 NOTE — ED Notes (Signed)
The pt is c/o rectal pain since Monday.  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

## 2013-05-20 NOTE — ED Provider Notes (Signed)
CSN: [id number]     Arrival date & time 05/20/13  0009 History   First MD Initiated Contact with Patient 05/20/13 0030     Chief Complaint  Patient presents with  . Rectal Pain     (Consider location/radiation/quality/duration/timing/severity/associated sxs/prior Treatment) HPI  48 year old male presents with rectal pain.  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 States he has to sit for 8 hours a day doing his work schedule. Pt report having pain to rectum for the past 2 days.  Describe pain as a stinging sensation.  Pain is worse when he sits directly on his bottom and improves when he sits on his side. He went to have a bowel movement today but no stools, just gas.  sts he was afraid to strain. He tries using Preparation H which provide some relief.  Denies fever, chills, headache, cp, sob, abd pain, back pain, dysuria, hematuria, hematochezia or melena.  No numbness or weakness.  No recent injury.  Has a remote hx of rectal hemorrhoid.  No prior colonoscopy.    Past Medical History  Diagnosis Date  . Hypertension    Past Surgical History  Procedure Laterality Date  . Knee arthroscopy     Family History  Problem Relation Age of Onset  . Hypertension Mother   . Diabetes Mother   . Hypertension Sister    History  Substance Use Topics  . Smoking status: Current Every Day Smoker -- 1.00 packs/day    Types: Cigarettes  . Smokeless tobacco: Not on file  . Alcohol Use: Yes     Comment: multiple 40s a day    Review of Systems  Constitutional: Negative for fever.  Gastrointestinal: Positive for rectal pain. Negative for abdominal pain, blood in stool and anal bleeding.  Skin: Negative for rash and wound.  Neurological: Negative for numbness.      Allergies  Review of patient's allergies indicates no known allergies.  Home Medications   Current Outpatient Rx  Name  Route  Sig  Dispense  Refill  . aspirin 325 MG tablet   Oral   Take 325 mg by mouth as  needed. For pain         . ibuprofen (ADVIL,MOTRIN) 600 MG tablet   Oral   Take 1 tablet (600 mg total) by mouth every 6 (six) hours as needed.   30 tablet   0    BP 181/114  Pulse 76  Temp(Src) 98.4 F (36.9 C) (Oral)  Resp 20  Ht 6' (1.829 m)  Wt 170 lb 6 oz (77.282 kg)  BMI 23.10 kg/m2  SpO2 100% Physical Exam  Constitutional: He is oriented to person, place, and time. He appears well-developed and well-nourished. No distress.  HENT:  Head: Atraumatic.  Eyes: Conjunctivae are normal.  Neck: Normal range of motion. Neck supple.  Cardiovascular: Normal rate and regular rhythm.   Pulmonary/Chest: Effort normal and breath sounds normal.  Abdominal: Soft. There is no tenderness.  Genitourinary: Prostate normal and penis normal. Guaiac negative stool. No penile tenderness.  Chaperone present:  Pt with moderate tenderness with digital rectal exam.  Normal rectal tone, external hemorrhoid noted at 12 oclock position , tender without evidence of thrombosis, no anal fissure, no mass, normal prostate, small amount of stool noted in rectal vault.  Hemoccult negative.    No scrotal pain, and no perineal pain  Neurological: He is alert and oriented to person, place, and time.  Skin: No rash noted.  Psychiatric:  He has a normal mood and affect.    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  Has his job for the past 5 weeks and having progressive rectal pain.  Does have external hemorrhoid, non thrombosed.  May have proctitis but not from infectious etiology.  No rectal bleeding.  Pt felt much better after receiving lido jelly via rectum.  Pt was initially hypertensive, likely 2/2 to pain.  He also has not been taking his BP for several days but states he is picking it up tomorrow.  Urge pt to f/u closely with his PCP for further care and for BP recheck.  Anusol prescribed for pain.   Work note given.  Return precaution discussed.  Pt voice understanding and agrees with plan.    Labs Review Labs Reviewed - No data to display Imaging Review No results found.   EKG Interpretation None      MDM   Final diagnoses:  Anal or rectal pain  External hemorrhoids    BP 170/105  Pulse 71  Temp(Src) 97.6 F (36.4 C) (Oral)  Resp 20  Ht 6' (1.829 m)  Wt 170 lb 6 oz (77.282 kg)  BMI 23.10 kg/m2  SpO2 100%     Fayrene HelperBowie Jaxxson Cavanah, PA-C 05/20/13 0155

## 2013-05-20 NOTE — ED Provider Notes (Signed)
Medical screening examination/treatment/procedure(s) were performed by non-physician practitioner and as supervising physician I was immediately available for consultation/collaboration.     Demetress Tift, MD 05/20/13 0440 

## 2013-05-26 ENCOUNTER — Emergency Department (HOSPITAL_COMMUNITY)
Admission: EM | Admit: 2013-05-26 | Discharge: 2013-05-26 | Disposition: A | Discharge status: Home / Self Care | Payer: BC Managed Care – PPO | Attending: Emergency Medicine | Admitting: Emergency Medicine

## 2013-05-26 ENCOUNTER — Encounter (HOSPITAL_COMMUNITY): Payer: Self-pay | Admitting: Emergency Medicine

## 2013-05-26 DIAGNOSIS — K6289 Other specified diseases of anus and rectum: Secondary | ICD-10-CM

## 2013-05-26 DIAGNOSIS — I10 Essential (primary) hypertension: Secondary | ICD-10-CM | POA: Insufficient documentation

## 2013-05-26 DIAGNOSIS — K644 Residual hemorrhoidal skin tags: Secondary | ICD-10-CM | POA: Insufficient documentation

## 2013-05-26 DIAGNOSIS — Z79899 Other long term (current) drug therapy: Secondary | ICD-10-CM | POA: Insufficient documentation

## 2013-05-26 DIAGNOSIS — K648 Other hemorrhoids: Secondary | ICD-10-CM | POA: Insufficient documentation

## 2013-05-26 DIAGNOSIS — F17200 Nicotine dependence, unspecified, uncomplicated: Secondary | ICD-10-CM | POA: Insufficient documentation

## 2013-05-26 DIAGNOSIS — K649 Unspecified hemorrhoids: Secondary | ICD-10-CM

## 2013-05-26 LAB — CBC WITH DIFFERENTIAL/PLATELET
BASOS ABS: 0 10*3/uL (ref 0.0–0.1)
Basophils Relative: 0 % (ref 0–1)
Eosinophils Absolute: 0.1 10*3/uL (ref 0.0–0.7)
Eosinophils Relative: 1 % (ref 0–5)
HEMATOCRIT: 39.7 % (ref 39.0–52.0)
HEMOGLOBIN: 14.2 g/dL (ref 13.0–17.0)
LYMPHS ABS: 1.7 10*3/uL (ref 0.7–4.0)
LYMPHS PCT: 20 % (ref 12–46)
MCH: 34 pg (ref 26.0–34.0)
MCHC: 35.8 g/dL (ref 30.0–36.0)
MCV: 95 fL (ref 78.0–100.0)
MONO ABS: 1.4 10*3/uL — AB (ref 0.1–1.0)
Monocytes Relative: 16 % — ABNORMAL HIGH (ref 3–12)
NEUTROS ABS: 5.2 10*3/uL (ref 1.7–7.7)
Neutrophils Relative %: 63 % (ref 43–77)
Platelets: 426 10*3/uL — ABNORMAL HIGH (ref 150–400)
RBC: 4.18 MIL/uL — AB (ref 4.22–5.81)
RDW: 11.9 % (ref 11.5–15.5)
WBC: 8.3 10*3/uL (ref 4.0–10.5)

## 2013-05-26 LAB — BASIC METABOLIC PANEL
BUN: 9 mg/dL (ref 6–23)
CHLORIDE: 98 meq/L (ref 96–112)
CO2: 25 meq/L (ref 19–32)
CREATININE: 1 mg/dL (ref 0.50–1.35)
Calcium: 9.5 mg/dL (ref 8.4–10.5)
GFR calc Af Amer: >90 mL/min (ref >90)
GFR calc non Af Amer: 88 mL/min — ABNORMAL LOW (ref >90)
GLUCOSE: 104 mg/dL — AB (ref 70–99)
Potassium: 4.1 mEq/L (ref 3.7–5.3)
Sodium: 140 mEq/L (ref 137–147)

## 2013-05-26 MED ORDER — FENTANYL CITRATE 0.05 MG/ML IJ SOLN: 100.0000 ug | INTRAMUSCULAR | Status: DC | PRN

## 2013-05-26 MED ORDER — MIDAZOLAM HCL 5 MG/5ML IJ SOLN: 2.0000 mg | INTRAMUSCULAR | Status: DC | PRN

## 2013-05-26 MED ORDER — ONDANSETRON HCL 4 MG/2ML IJ SOLN
4.0000 mg | Freq: Once | INTRAMUSCULAR | Status: AC
  Administered 2013-05-26: 4 mg via INTRAVENOUS
  Filled 2013-05-26: qty 2

## 2013-05-26 MED ORDER — LIDOCAINE HCL 2 % EX GEL
Freq: Once | CUTANEOUS | Status: AC
  Administered 2013-05-26: 20 via URETHRAL
  Filled 2013-05-26: qty 20

## 2013-05-26 MED ORDER — DOCUSATE SODIUM 100 MG PO CAPS: 100.0000 mg | ORAL_CAPSULE | Freq: Every day | ORAL | Status: DC | PRN

## 2013-05-26 MED ORDER — HYDROCODONE-ACETAMINOPHEN 5-325 MG PO TABS: 1.0000 | ORAL_TABLET | ORAL | Status: DC | PRN

## 2013-05-26 MED ORDER — MORPHINE SULFATE 4 MG/ML IJ SOLN
4.0000 mg | Freq: Once | INTRAMUSCULAR | Status: AC
  Administered 2013-05-26: 4 mg via INTRAVENOUS
  Filled 2013-05-26: qty 1

## 2013-05-26 MED ORDER — LIDOCAINE HCL (CARDIAC) 20 MG/ML IV SOLN
INTRAVENOUS | Status: AC
  Filled 2013-05-26: qty 5

## 2013-05-26 NOTE — ED Notes (Signed)
Pt states did not read his d/c instructions about sitting in sitz baths or using other creams to help

## 2013-05-26 NOTE — Discharge Instructions (Signed)
Read the information below.  Use the prescribed medication as directed.  Please discuss all new medications with your pharmacist.  Do not take additional tylenol while taking the prescribed pain medication to avoid overdose.  You may return to the Emergency Department at any time for worsening condition or any new symptoms that concern you.  If you develop fevers, abdominal pain, uncontrolled rectal or anal pain, or you are unable to have a bowel movement, return to the ER for a recheck.    Hemorrhoids Hemorrhoids are swollen veins around the rectum or anus. There are two types of hemorrhoids:   Internal hemorrhoids. These occur in the veins just inside the rectum. They may poke through to the outside and become irritated and painful.  External hemorrhoids. These occur in the veins outside the anus and can be felt as a painful swelling or hard lump near the anus. CAUSES  Pregnancy.   Obesity.   Constipation or diarrhea.   Straining to have a bowel movement.   Sitting for long periods on the toilet.  Heavy lifting or other activity that caused you to strain.  Anal intercourse. SYMPTOMS   Pain.   Anal itching or irritation.   Rectal bleeding.   Fecal leakage.   Anal swelling.   One or more lumps around the anus.  DIAGNOSIS  Your caregiver may be able to diagnose hemorrhoids by visual examination. Other examinations or tests that may be performed include:   Examination of the rectal area with a gloved hand (digital rectal exam).   Examination of anal canal using a small tube (scope).   A blood test if you have lost a significant amount of blood.  A test to look inside the colon (sigmoidoscopy or colonoscopy). TREATMENT Most hemorrhoids can be treated at home. However, if symptoms do not seem to be getting better or if you have a lot of rectal bleeding, your caregiver may perform a procedure to help make the hemorrhoids get smaller or remove them completely.  Possible treatments include:   Placing a rubber band at the base of the hemorrhoid to cut off the circulation (rubber band ligation).   Injecting a chemical to shrink the hemorrhoid (sclerotherapy).   Using a tool to burn the hemorrhoid (infrared light therapy).   Surgically removing the hemorrhoid (hemorrhoidectomy).   Stapling the hemorrhoid to block blood flow to the tissue (hemorrhoid stapling).  HOME CARE INSTRUCTIONS   Eat foods with fiber, such as whole grains, beans, nuts, fruits, and vegetables. Ask your doctor about taking products with added fiber in them (fibersupplements).  Increase fluid intake. Drink enough water and fluids to keep your urine clear or pale yellow.   Exercise regularly.   Go to the bathroom when you have the urge to have a bowel movement. Do not wait.   Avoid straining to have bowel movements.   Keep the anal area dry and clean. Use wet toilet paper or moist towelettes after a bowel movement.   Medicated creams and suppositories may be used or applied as directed.   Only take over-the-counter or prescription medicines as directed by your caregiver.   Take warm sitz baths for 15 20 minutes, 3 4 times a day to ease pain and discomfort.   Place ice packs on the hemorrhoids if they are tender and swollen. Using ice packs between sitz baths may be helpful.   Put ice in a plastic bag.   Place a towel between your skin and the bag.   Leave  the ice on for 15 20 minutes, 3 4 times a day.   Do not use a donut-shaped pillow or sit on the toilet for long periods. This increases blood pooling and pain.  SEEK MEDICAL CARE IF:  You have increasing pain and swelling that is not controlled by treatment or medicine.  You have uncontrolled bleeding.  You have difficulty or you are unable to have a bowel movement.  You have pain or inflammation outside the area of the hemorrhoids. MAKE SURE YOU:  Understand these instructions.  Will  watch your condition.  Will get help right away if you are not doing well or get worse. Document Released: 02/02/2000 Document Revised: 01/22/2012 Document Reviewed: 12/10/2011 [facility] Patient Information 2014 [place], [place].  Fiber Content in Foods Drinking plenty of fluids and consuming foods high in fiber can help with constipation. See the list below for the fiber content of some common foods. Starches and Grains / Dietary Fiber (g)  Cheerios, 1 cup / 3 g  Kellogg's Corn Flakes, 1 cup / 0.7 g  Rice Krispies, 1  cup / 0.3 g  Quaker Oat Life Cereal,  cup / 2.1 g  Oatmeal, instant (cooked),  cup / 2 g  Kellogg's Frosted Mini Wheats, 1 cup / 5.1 g  Rice, brown, long-grain (cooked), 1 cup / 3.5 g  Rice, white, long-grain (cooked), 1 cup / 0.6 g  Macaroni, cooked, enriched, 1 cup / 2.5 g Legumes / Dietary Fiber (g)  Beans, baked, canned, plain or vegetarian,  cup / 5.2 g  Beans, kidney, canned,  cup / 6.8 g  Beans, pinto, dried (cooked),  cup / 7.7 g  Beans, pinto, canned,  cup / 5.5 g Breads and Crackers / Dietary Fiber (g)  Graham crackers, plain or honey, 2 squares / 0.7 g  Saltine crackers, 3 squares / 0.3 g  Pretzels, plain, salted, 10 pieces / 1.8 g  Bread, whole-wheat, 1 slice / 1.9 g  Bread, white, 1 slice / 0.7 g  Bread, raisin, 1 slice / 1.2 g  Bagel, plain, 3 oz / 2 g  Tortilla, flour, 1 oz / 0.9 g  Tortilla, corn, 1 small / 1.5 g  Bun, hamburger or hotdog, 1 small / 0.9 g Fruits / Dietary Fiber (g)  Apple, raw with skin, 1 medium / 4.4 g  Applesauce, sweetened,  cup / 1.5 g  Banana,  medium / 1.5 g  Grapes, 10 grapes / 0.4 g  Orange, 1 small / 2.3 g  Raisin, 1.5 oz / 1.6 g  Melon, 1 cup / 1.4 g Vegetables / Dietary Fiber (g)  Green beans, canned,  cup / 1.3 g  Carrots (cooked),  cup / 2.3 g  Broccoli (cooked),  cup / 2.8 g  Peas, frozen (cooked),  cup / 4.4 g  Potatoes, mashed,  cup / 1.6 g  Lettuce, 1 cup /  0.5 g  Corn, canned,  cup / 1.6 g  Tomato,  cup / 1.1 g Document Released: 06/23/2006 Document Revised: 04/29/2011 Document Reviewed: 08/18/2006 ExitCare Patient Information 2014 [place], [place].

## 2013-05-26 NOTE — ED Provider Notes (Signed)
Medical screening examination/treatment/procedure(s) were performed by non-physician practitioner and as supervising physician I was immediately available for consultation/collaboration.   Nelia Shiobert L Travin Marik, MD 05/26/13 1028

## 2013-05-26 NOTE — ED Notes (Signed)
Was seen for hemorrhoids pain last week pt states that he is still sore given meds but still sore

## 2013-05-26 NOTE — ED Notes (Signed)
Pt given lido jelly for his bottom and he applied it pt given instructions to READ his d/c instructions sit in warm sitz baths and increase his fiber

## 2013-05-26 NOTE — ED Provider Notes (Signed)
CSN: [id number]     Arrival date & time 05/26/13  [id number] History   First MD Initiated Contact with Patient 05/26/13 [phone]     Chief Complaint  Patient presents with  . Rectal Pain     (Consider location/radiation/quality/duration/timing/severity/associated sxs/prior Treatment) HPI  Patient presents with rectal pain.  Pain has been ongoing for 8 days, was seen in ED 6 days ago diagnosed with anal pain and external hemorrhoid.  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  Has been using anusol suppositories without improvement.  Is having liquid/soft stools without blood or mucus.  Last BM was this morning.  Denies fevers, abdominal pain, vomiting, urinary symptoms, testicular pain or swelling.    Past Medical History  Diagnosis Date  . Hypertension    Past Surgical History  Procedure Laterality Date  . Knee arthroscopy     Family History  Problem Relation Age of Onset  . Hypertension Mother   . Diabetes Mother   . Hypertension Sister    History  Substance Use Topics  . Smoking status: Current Every Day Smoker -- 1.00 packs/day    Types: Cigarettes  . Smokeless tobacco: Not on file  . Alcohol Use: Yes     Comment: multiple 40s a day    Review of Systems  Constitutional: Negative for fever.  Respiratory: Negative for cough and shortness of breath.   Cardiovascular: Negative for chest pain.  Gastrointestinal: Positive for rectal pain. Negative for nausea, vomiting, abdominal pain, diarrhea, constipation, blood in stool and anal bleeding.  Genitourinary: Negative for dysuria, urgency, frequency, discharge, scrotal swelling, penile pain and testicular pain.  All other systems reviewed and are negative.     Allergies  Review of patient's allergies indicates no known allergies.  Home Medications   Current Outpatient Rx  Name  Route  Sig  Dispense  Refill  .  amLODipine (NORVASC) 10 MG tablet   Oral   Take 10 mg by mouth daily.         Marland Kitchen. lisinopril (PRINIVIL,ZESTRIL) 20 MG tablet   Oral   Take 20 mg by mouth daily.         . hydrocortisone (ANUSOL-HC) 25 MG suppository   Rectal   Place 1 suppository (25 mg total) rectally 2 (two) times daily. For 7 days   14 suppository   0    There were no vitals taken for this visit. Physical Exam  Nursing note and vitals reviewed. Constitutional: He appears well-developed and well-nourished. No distress.  HENT:  Head: Normocephalic and atraumatic.  Neck: Neck supple.  Pulmonary/Chest: Effort normal.  Abdominal: Soft. He exhibits no distension and no mass. There is no tenderness. There is no rebound and no guarding.  Genitourinary: Rectal exam shows external hemorrhoid, internal hemorrhoid and tenderness. Rectal exam shows anal tone normal.     Rectal exam with chaperon present.  Multiple internal hemorrhoids, tender to palpation. Small soft external hemorrhoid, tender to palpation, reproduces pain.   Neurological: He is alert.  Skin: He is not diaphoretic.    ED Course  Procedures (including critical care time) Labs Review Labs Reviewed  CBC WITH DIFFERENTIAL - Abnormal; Notable for the following:    RBC 4.18 (*)    Platelets 426 (*)    Monocytes Relative 16 (*)    Monocytes Absolute 1.4 (*)    All other components within normal limits  BASIC METABOLIC PANEL -  Abnormal; Notable for the following:    Glucose, Bld 104 (*)    GFR calc non Af Amer 88 (*)    All other components within normal limits   Imaging Review No results found.   EKG Interpretation None      9:05 AM Pt states he is unable to tolerate rectal exam at this time, will reevaluate after pain medication.   Pt has HTN medication waiting for him at the pharmacy.  Discussed risks of prolonged HTN, encouraged PCP follow up, taking BP medications.   MDM   Final diagnoses:  Hemorrhoids  Anal pain    Afebrile,  nontoxic patient with anal pain, internal and small external hemorrhoids.  Doubt thrombosed hemorrhoid at this time.  Doubt perirectal abscess.  Labs unremarkable.  D/C home with pain medication, general surgery follow up.  Encouraged continued use of anusol suppository, encouraged sitz baths, also added colace prescription.  Discussed result, findings, treatment, and follow up  with patient.  Pt given return precautions.  Pt verbalizes understanding and agrees with plan.        Trixie Dredge, PA-C 05/26/13 1024

## 2014-09-16 ENCOUNTER — Emergency Department (HOSPITAL_COMMUNITY)
Admission: EM | Admit: 2014-09-16 | Discharge: 2014-09-16 | Disposition: A | Discharge status: Home / Self Care | Payer: BLUE CROSS/BLUE SHIELD | Source: Home / Self Care | Attending: Family Medicine | Admitting: Family Medicine

## 2014-09-16 ENCOUNTER — Emergency Department (INDEPENDENT_AMBULATORY_CARE_PROVIDER_SITE_OTHER): Payer: BLUE CROSS/BLUE SHIELD

## 2014-09-16 ENCOUNTER — Encounter (HOSPITAL_COMMUNITY): Payer: Self-pay | Admitting: Emergency Medicine

## 2014-09-16 DIAGNOSIS — M25551 Pain in right hip: Secondary | ICD-10-CM

## 2014-09-16 DIAGNOSIS — R52 Pain, unspecified: Secondary | ICD-10-CM | POA: Diagnosis not present

## 2014-09-16 MED ORDER — MELOXICAM 7.5 MG PO TABS: 7.5000 mg | ORAL_TABLET | Freq: Two times a day (BID) | ORAL | Status: AC

## 2014-09-16 MED ORDER — HYDROCODONE-ACETAMINOPHEN 5-325 MG PO TABS: 1.0000 | ORAL_TABLET | Freq: Four times a day (QID) | ORAL | Status: DC | PRN

## 2014-09-16 NOTE — Discharge Instructions (Signed)
Light activity as tolerated, use medicine as needed, call dr Magnus Ivan for appt next week.

## 2014-09-16 NOTE — ED Notes (Signed)
Right hip to mid thigh pain.  Onset Monday morning of pain, pain woke patient.  Patient blames pain on dancing Friday night.

## 2014-09-16 NOTE — ED Provider Notes (Signed)
CSN: [id number]     Arrival date & time 09/16/14  1337 History   First MD Initiated Contact with Patient 09/16/14 1427     Chief Complaint  Patient presents with  . Hip Pain   (Consider location/radiation/quality/duration/timing/severity/associated sxs/prior Treatment) Patient is a 49 y.o. male presenting with hip pain. The history is provided by the patient.  Hip Pain This is a new problem. The current episode started more than 1 week ago. The problem has been gradually worsening. Pertinent negatives include no abdominal pain. The symptoms are aggravated by walking.    Past Medical History  Diagnosis Date  . Hypertension    Past Surgical History  Procedure Laterality Date  . Knee arthroscopy     Family History  Problem Relation Age of Onset  . Hypertension Mother   . Diabetes Mother   . Hypertension Sister    History  Substance Use Topics  . Smoking status: Current Every Day Smoker -- 1.00 packs/day    Types: Cigarettes  . Smokeless tobacco: Not on file  . Alcohol Use: Yes     Comment: multiple 40s a day    Review of Systems  Gastrointestinal: Negative for abdominal pain.  Musculoskeletal: Positive for gait problem. Negative for myalgias, back pain and joint swelling.  Skin: Negative.     Allergies  Review of patient's allergies indicates no known allergies.  Home Medications   Prior to Admission medications   Medication Sig Start Date End Date Taking? Authorizing Provider  IBUPROFEN PO Take by mouth.   Yes Historical Provider, MD  amLODipine (NORVASC) 10 MG tablet Take 10 mg by mouth daily.    Historical Provider, MD  docusate sodium (COLACE) 100 MG capsule Take 1 capsule (100 mg total) by mouth daily as needed for mild constipation or moderate constipation (or hard stools). 05/26/13   Trixie Dredge, PA-C  HYDROcodone-acetaminophen (NORCO/VICODIN) 5-325 MG per tablet Take 1 tablet by mouth every 6 (six) hours as needed for moderate pain. 09/16/14   Linna Hoff, MD   hydrocortisone (ANUSOL-HC) 25 MG suppository Place 1 suppository (25 mg total) rectally 2 (two) times daily. For 7 days 05/20/13   Fayrene Helper, PA-C  lisinopril (PRINIVIL,ZESTRIL) 20 MG tablet Take 20 mg by mouth daily.    Historical Provider, MD  meloxicam (MOBIC) 7.5 MG tablet Take 1 tablet (7.5 mg total) by mouth 2 (two) times daily after a meal. 09/16/14   Linna Hoff, MD   BP 163/95 mmHg  Pulse 81  Temp(Src) 98.5 F (36.9 C) (Oral)  Resp 16  SpO2 97% Physical Exam  Constitutional: He is oriented to person, place, and time. He appears well-developed and well-nourished.  Musculoskeletal: He exhibits tenderness.       Right hip: He exhibits tenderness and bony tenderness. He exhibits normal range of motion, normal strength and no deformity.  Neurological: He is alert and oriented to person, place, and time.  Skin: Skin is warm and dry.  Nursing note and vitals reviewed.   ED Course  Procedures (including critical care time) Labs Review Labs Reviewed - No data to display  Imaging Review Dg Hip Unilat With Pelvis 2-3 Views Right  09/16/2014   CLINICAL DATA:  49 year old male with right hip pain since going out dancing 5 days ago.  EXAM: DG HIP (WITH OR WITHOUT PELVIS) 2-3V RIGHT  COMPARISON:  None.  FINDINGS: Bosie Clos slightly increased sclerosis in the right femoral head concerning for avascular necrosis. No evidence of subchondral lucency, collapse or articular  surface irregularity. There is no evidence of acute fracture or malalignment. Unremarkable visualized bowel gas pattern. Otherwise normal bony mineralization.  IMPRESSION: 1. Subtle increased sclerosis in the right femoral head concerning for possible avascular necrosis. If further imaging is clinically warranted, MRI could further evaluate.   Electronically Signed   By: Malachy Moan M.D.   On: 09/16/2014 15:26   X-rays reviewed and report per radiologist.   MDM   1. Hip pain, right   2. Pain aggravated by activities of  daily living    Discussed with dr Magnus Ivan, plans as advised.    Linna Hoff, MD 09/16/14 [phone]

## 2015-05-23 ENCOUNTER — Emergency Department (HOSPITAL_COMMUNITY)
Admission: EM | Admit: 2015-05-23 | Discharge: 2015-05-23 | Disposition: A | Discharge status: Home / Self Care | Payer: BLUE CROSS/BLUE SHIELD | Attending: Emergency Medicine | Admitting: Emergency Medicine

## 2015-05-23 ENCOUNTER — Encounter (HOSPITAL_COMMUNITY): Payer: Self-pay

## 2015-05-23 DIAGNOSIS — Y998 Other external cause status: Secondary | ICD-10-CM | POA: Diagnosis not present

## 2015-05-23 DIAGNOSIS — Y9389 Activity, other specified: Secondary | ICD-10-CM | POA: Insufficient documentation

## 2015-05-23 DIAGNOSIS — I10 Essential (primary) hypertension: Secondary | ICD-10-CM | POA: Insufficient documentation

## 2015-05-23 DIAGNOSIS — S0592XA Unspecified injury of left eye and orbit, initial encounter: Secondary | ICD-10-CM | POA: Insufficient documentation

## 2015-05-23 DIAGNOSIS — Y9289 Other specified places as the place of occurrence of the external cause: Secondary | ICD-10-CM | POA: Diagnosis not present

## 2015-05-23 DIAGNOSIS — F17210 Nicotine dependence, cigarettes, uncomplicated: Secondary | ICD-10-CM | POA: Diagnosis not present

## 2015-05-23 DIAGNOSIS — T63441A Toxic effect of venom of bees, accidental (unintentional), initial encounter: Secondary | ICD-10-CM | POA: Insufficient documentation

## 2015-05-23 DIAGNOSIS — Z791 Long term (current) use of non-steroidal anti-inflammatories (NSAID): Secondary | ICD-10-CM | POA: Insufficient documentation

## 2015-05-23 DIAGNOSIS — Z79899 Other long term (current) drug therapy: Secondary | ICD-10-CM | POA: Diagnosis not present

## 2015-05-23 DIAGNOSIS — X58XXXA Exposure to other specified factors, initial encounter: Secondary | ICD-10-CM | POA: Diagnosis not present

## 2015-05-23 NOTE — ED Notes (Signed)
Pt reports he was stung by multiple bumble bees on Sunday and he also has abrasion to left orbital area. He denies allergies to bees. He reports he did not go to work today and needs a work [employer], [employer]. He wants to be evaluated to be sure he is ok. Denies current vision changes but reports he couldn't see yesterday.

## 2015-05-23 NOTE — ED Provider Notes (Signed)
CSN: [id number]     Arrival date & time 05/23/15  1725 History  By signing my name below, I, Marcus Cooper, attest that this documentation has been prepared under the direction and in the presence of Anselm PancoastShawn C Tyqwan Pink, PA-C  Electronically Signed: Iona Beardhristian Cooper, ED Scribe 05/23/2015 at 8:40 PM.    Chief Complaint  Patient presents with  . Insect Bite    The history is provided by the patient. No language interpreter was used.   HPI Comments: Marcus Cooper is a 50 y.o. male who presents to the Emergency Department complaining of multiple bee stings on his face, onset two days ago. Pt reports associated mild swelling and abrasion to the left orbital area which occurred after he peeled off the dry skin from the stings. No other associated symptoms noted. He used antibacterial ointment and ice PTA with significant improvement to swelling. No other worsening or alleviating factors noted. Pt denies bee allergy, visual disturbances, vomiting, cough, difficulty breathing, or any other pertinent symptoms.   Past Medical History  Diagnosis Date  . Hypertension    Past Surgical History  Procedure Laterality Date  . Knee arthroscopy     Family History  Problem Relation Age of Onset  . Hypertension Mother   . Diabetes Mother   . Hypertension Sister    Social History  Substance Use Topics  . Smoking status: Current Every Day Smoker -- 1.00 packs/day    Types: Cigarettes  . Smokeless tobacco: None  . Alcohol Use: Yes     Comment: multiple 40s a day    Review of Systems  Eyes: Negative for photophobia, pain, discharge and visual disturbance.  Respiratory: Negative for cough and shortness of breath.   Cardiovascular: Negative for chest pain.  Gastrointestinal: Negative for nausea, vomiting and abdominal pain.  Skin:       Multiple bee stings noted to face.  Swelling and abrasion to left orbital area.   Neurological: Negative for dizziness, weakness, light-headedness, numbness and headaches.   All other systems reviewed and are negative.   Allergies  Review of patient's allergies indicates no known allergies.  Home Medications   Prior to Admission medications   Medication Sig Start Date End Date Taking? Authorizing Provider  amLODipine (NORVASC) 10 MG tablet Take 10 mg by mouth daily.   Yes Historical Provider, MD  lisinopril (PRINIVIL,ZESTRIL) 20 MG tablet Take 20 mg by mouth daily.   Yes Historical Provider, MD  meloxicam (MOBIC) 7.5 MG tablet Take 1 tablet (7.5 mg total) by mouth 2 (two) times daily after a meal. 09/16/14   Linna HoffJames D Kindl, MD   BP 186/120 mmHg  Pulse 85  Temp(Src) 98.6 F (37 C) (Oral)  Resp 18  SpO2 98% Physical Exam  Constitutional: He is oriented to person, place, and time. He appears well-developed and well-nourished. No distress.  HENT:  Head: Normocephalic and atraumatic.     Eyes: Conjunctivae and EOM are normal. Pupils are equal, round, and reactive to light.  Swelling to the left superior rim of orbit and inferior rim of orbit. He can fully open his eye. No damage to eye.  Neck: Normal range of motion. Neck supple.  Cardiovascular: Normal rate, regular rhythm, normal heart sounds and intact distal pulses.   Pulmonary/Chest: Effort normal and breath sounds normal. No respiratory distress.  Abdominal: Soft. He exhibits no distension. There is no tenderness. There is no guarding.  Musculoskeletal: He exhibits no edema or tenderness.  Lymphadenopathy:    He has no  cervical adenopathy.  Neurological: He is alert and oriented to person, place, and time. He has normal reflexes.  No sensory deficits. Strength 5/5 in all extremities. No gait disturbance. Coordination intact. Cranial nerves III-XII grossly intact. No facial droop.   Skin: Skin is warm and dry. He is not diaphoretic.  Psychiatric: He has a normal mood and affect. His behavior is normal.  Nursing note and vitals reviewed.   ED Course  Procedures (including critical care  time) DIAGNOSTIC STUDIES: Oxygen Saturation is 98% on RA, normal by my interpretation.    COORDINATION OF CARE: 8:34 PM-Discussed treatment plan with pt at bedside and pt agreed to plan.    MDM  Treatment plan includes continued use of ice and antibacterial ointment to symptomatic areas.   Final diagnoses:  Bee sting, accidental or unintentional, initial encounter   Marcus Cooper presents with bee stings that occurred 2 days ago.  Patient's symptoms are improving with the patient's home management. He shows no signs of systemic allergic reaction. No red flag symptoms. Patient merely wanted reassurance that his management was correct. Patient's hypertension is noted, but he is asymptomatic to this. No signs of hypertensive emergency. Patient states that he has been out of his blood pressure medications for a few weeks. Full exam performed due to the patient's blood pressure. Patient was advised to go to a PCP to have his medications renewed. Home care and return precautions discussed. Patient voiced understanding of these instructions and is comfortable with discharge.  I personally performed the services described in this documentation, which was scribed in my presence. The recorded information has been reviewed and is accurate.   Anselm Pancoast, PA-C 05/23/15 2040  Anselm Pancoast, PA-C 05/23/15 2041  Arby Barrette, MD 05/24/15 [phone]

## 2015-05-23 NOTE — Discharge Instructions (Signed)
You have been seen today for bee stings. Continue applying ice in intervals no longer than 15 minutes at a time. Apply antibacterial ointment to the wounds. Follow up with PCP as needed. Return to ED should symptoms worsen.

## 2017-10-20 ENCOUNTER — Emergency Department (HOSPITAL_COMMUNITY): Payer: BLUE CROSS/BLUE SHIELD

## 2017-10-20 ENCOUNTER — Other Ambulatory Visit: Payer: Self-pay

## 2017-10-20 ENCOUNTER — Encounter (HOSPITAL_COMMUNITY): Payer: Self-pay

## 2017-10-20 ENCOUNTER — Emergency Department (HOSPITAL_COMMUNITY)
Admission: EM | Admit: 2017-10-20 | Discharge: 2017-10-20 | Disposition: A | Discharge status: Home / Self Care | Payer: BLUE CROSS/BLUE SHIELD | Attending: Emergency Medicine | Admitting: Emergency Medicine

## 2017-10-20 DIAGNOSIS — R05 Cough: Secondary | ICD-10-CM | POA: Insufficient documentation

## 2017-10-20 DIAGNOSIS — F17210 Nicotine dependence, cigarettes, uncomplicated: Secondary | ICD-10-CM | POA: Insufficient documentation

## 2017-10-20 DIAGNOSIS — R0602 Shortness of breath: Secondary | ICD-10-CM

## 2017-10-20 DIAGNOSIS — I10 Essential (primary) hypertension: Secondary | ICD-10-CM

## 2017-10-20 DIAGNOSIS — R059 Cough, unspecified: Secondary | ICD-10-CM

## 2017-10-20 DIAGNOSIS — Z79899 Other long term (current) drug therapy: Secondary | ICD-10-CM | POA: Insufficient documentation

## 2017-10-20 LAB — URINALYSIS, ROUTINE W REFLEX MICROSCOPIC
Bacteria, UA: NONE SEEN
Bilirubin Urine: NEGATIVE
Glucose, UA: NEGATIVE mg/dL
Hgb urine dipstick: NEGATIVE
Ketones, ur: 20 mg/dL — AB
Leukocytes, UA: NEGATIVE
Nitrite: NEGATIVE
Protein, ur: 30 mg/dL — AB
Specific Gravity, Urine: 1.014 (ref 1.005–1.030)
pH: 6 (ref 5.0–8.0)

## 2017-10-20 LAB — BASIC METABOLIC PANEL
Anion gap: 11 (ref 5–15)
CALCIUM: 9.2 mg/dL (ref 8.9–10.3)
CHLORIDE: 98 mmol/L (ref 98–111)
CO2: 27 mmol/L (ref 22–32)
Creatinine, Ser: 0.97 mg/dL (ref 0.61–1.24)
GFR calc Af Amer: >60 mL/min (ref >60)
GLUCOSE: 112 mg/dL — AB (ref 70–99)
Potassium: 3.9 mmol/L (ref 3.5–5.1)
Sodium: 136 mmol/L (ref 135–145)

## 2017-10-20 LAB — CBC
HCT: 41.5 % (ref 39.0–52.0)
Hemoglobin: 13.7 g/dL (ref 13.0–17.0)
MCH: 32.5 pg (ref 26.0–34.0)
MCHC: 33 g/dL (ref 30.0–36.0)
MCV: 98.3 fL (ref 78.0–100.0)
PLATELETS: 423 10*3/uL — AB (ref 150–400)
RBC: 4.22 MIL/uL (ref 4.22–5.81)
RDW: 13.1 % (ref 11.5–15.5)
WBC: 16.5 10*3/uL — ABNORMAL HIGH (ref 4.0–10.5)

## 2017-10-20 LAB — I-STAT TROPONIN, ED: Troponin i, poc: 0.02 ng/mL (ref 0.00–0.08)

## 2017-10-20 MED ORDER — LISINOPRIL 20 MG PO TABS
20.0000 mg | ORAL_TABLET | Freq: Once | ORAL | Status: AC
  Administered 2017-10-20: 20 mg via ORAL
  Filled 2017-10-20: qty 1

## 2017-10-20 MED ORDER — LISINOPRIL 20 MG PO TABS: 20.0000 mg | ORAL_TABLET | Freq: Every day | ORAL | 0 refills | Status: AC

## 2017-10-20 MED ORDER — ALBUTEROL SULFATE HFA 108 (90 BASE) MCG/ACT IN AERS
1.0000 | INHALATION_SPRAY | Freq: Once | RESPIRATORY_TRACT | Status: AC
  Administered 2017-10-20: 1 via RESPIRATORY_TRACT
  Filled 2017-10-20: qty 6.7

## 2017-10-20 MED ORDER — PREDNISONE 10 MG PO TABS: 40.0000 mg | ORAL_TABLET | Freq: Every day | ORAL | 0 refills | Status: AC

## 2017-10-20 MED ORDER — ALBUTEROL SULFATE (2.5 MG/3ML) 0.083% IN NEBU
2.5000 mg | INHALATION_SOLUTION | Freq: Once | RESPIRATORY_TRACT | Status: AC
  Administered 2017-10-20: 2.5 mg via RESPIRATORY_TRACT
  Filled 2017-10-20: qty 3

## 2017-10-20 MED ORDER — AZITHROMYCIN 250 MG PO TABS: 250.0000 mg | ORAL_TABLET | Freq: Every day | ORAL | 0 refills | Status: AC

## 2017-10-20 MED ORDER — AMLODIPINE BESYLATE 10 MG PO TABS: 10.0000 mg | ORAL_TABLET | Freq: Every day | ORAL | 0 refills | Status: AC

## 2017-10-20 MED ORDER — AMLODIPINE BESYLATE 5 MG PO TABS
10.0000 mg | ORAL_TABLET | Freq: Once | ORAL | Status: AC
  Administered 2017-10-20: 10 mg via ORAL
  Filled 2017-10-20: qty 2

## 2017-10-20 NOTE — Discharge Instructions (Addendum)
Please take all of your antibiotics until finished!   You may develop abdominal discomfort or diarrhea from the antibiotic.  You may help offset this with probiotics which you can buy or get in yogurt. Do not eat  or take the probiotics until 2 hours after your antibiotic.   Take prednisone as prescribed.  You can use the albuterol inhaler 1 to 2 puffs every 4-6 hours as needed for shortness of breath.  Take your blood pressure medicine every day as prescribed.  Drink plenty of water and get plenty of rest.  Follow-up with [facility] for reevaluation.  Tell them you were referred from the emergency department.  They will help control your blood pressure.  Return to the emergency department immediately if you have any concerning signs or symptoms such as fevers, worsening chest pain, worsening shortness of breath especially despite the use of your medications, coughing up blood, severe headaches, or decreased urine production.  If your blood pressure (BP) was elevated on multiple readings during this visit above 130 for the top number or above 80 for the bottom number, please have this repeated by your primary care provider within one month. You can also check your blood pressure when you are out at a pharmacy or grocery store. Many have machines that will check your blood pressure.  If your blood pressure remains elevated, please follow-up with your PCP.

## 2017-10-20 NOTE — ED Notes (Signed)
Pt ambulatory throughout hallway without assistance. O2 saturation remained in mid 90s throughout, gait steady, no shortness of breath noted.

## 2017-10-20 NOTE — ED Triage Notes (Signed)
Patient complains of 2 days of cough, congestion, weakness with chills and fever. Complains of body aches and fatigue with same. Alert and oriented

## 2017-10-20 NOTE — ED Provider Notes (Signed)
[facility] EMERGENCY DEPARTMENT Provider Note   CSN: [id number] Arrival date & time: 10/20/17  1054     History   Chief Complaint Chief Complaint  Patient presents with  . chills/SOB/body aches    HPI Marcus Cooper is a 52 y.o. male with history of hypertension presents today for evaluation of acute onset, progressively worsening generalized myalgias, fever, cough for 2 days.  T-max 101.5 F at home with improvement with Tylenol.  He notes cough productive of yellow-green sputum.  He notes mild chest wall achiness when cough is persistent, denies chest pain otherwise.  Endorses some dyspnea on exertion and when laying flat.  Improves sitting upright.  Also endorses a scratchy throat but denies sore throat, nasal congestion, or ear pain.  No headaches.  He has tried Robitussin and Tylenol with mild relief.  Endorses decreased oral intake.  Denies abdominal pain, nausea, vomiting, urinary symptoms, diarrhea, constipation.  He has not been taking blood pressure medicine for upwards of over a year because he could not afford to continue seeing his PCP.  He is a current smoker of approximately a third of a pack of cigarettes daily.  The history is provided by the patient.    Past Medical History:  Diagnosis Date  . Hypertension     There are no active problems to display for this patient.   Past Surgical History:  Procedure Laterality Date  . KNEE ARTHROSCOPY          Home Medications    Prior to Admission medications   Medication Sig Start Date End Date Taking? Authorizing Provider  amLODipine (NORVASC) 10 MG tablet Take 1 tablet (10 mg total) by mouth daily. 10/20/17   Jarrell Armond A, PA-C  azithromycin (ZITHROMAX) 250 MG tablet Take 1 tablet (250 mg total) by mouth daily. Take first 2 tablets together, then 1 every day until finished. 10/20/17   Ishitha Roper A, PA-C  lisinopril (PRINIVIL,ZESTRIL) 20 MG tablet Take 1 tablet (20 mg total) by mouth daily. 10/20/17    Luevenia Maxin, Alexandre Lightsey A, PA-C  meloxicam (MOBIC) 7.5 MG tablet Take 1 tablet (7.5 mg total) by mouth 2 (two) times daily after a meal. 09/16/14   Kindl, Quita Skye, MD  predniSONE (DELTASONE) 10 MG tablet Take 4 tablets (40 mg total) by mouth daily with breakfast for 5 days. 10/20/17 10/25/17  Jeanie Sewer, PA-C    Family History Family History  Problem Relation Age of Onset  . Hypertension Mother   . Diabetes Mother   . Hypertension Sister     Social History Social History   Tobacco Use  . Smoking status: Current Every Day Smoker    Packs/day: 1.00    Types: Cigarettes  . Smokeless tobacco: Never Used  Substance Use Topics  . Alcohol use: Yes    Comment: multiple 40s a day  . Drug use: No     Allergies   Patient has no known allergies.   Review of Systems Review of Systems  Constitutional: Positive for chills and fever.  HENT: Negative for congestion, sore throat and trouble swallowing.   Eyes: Negative for visual disturbance.  Respiratory: Positive for cough and shortness of breath.   Cardiovascular: Negative for chest pain (with cough only).  Gastrointestinal: Negative for abdominal pain, nausea and vomiting.  Genitourinary: Negative for decreased urine volume and difficulty urinating.  Neurological: Negative for headaches.  All other systems reviewed and are negative.    Physical Exam Updated Vital Signs BP (!) 161/110 (  BP Location: Right Arm)   Pulse (!) 108   Temp 99 F (37.2 C)   Resp 18   SpO2 94%   Physical Exam  Constitutional: He appears well-developed and well-nourished. No distress.  HENT:  Head: Normocephalic and atraumatic.  Right Ear: External ear normal.  Left Ear: External ear normal.  Mouth/Throat: Oropharynx is clear and moist.  Posterior oropharynx with no erythema, tonsillar hypertrophy, exudates, or uvular deviation.  TMs without erythema or bulging bilaterally.  Nasal septum midline with no mucosal edema.  No frontal or maxillary sinus tenderness.    Eyes: Conjunctivae are normal. Right eye exhibits no discharge. Left eye exhibits no discharge.  Neck: Normal range of motion. Neck supple. No JVD present. No tracheal deviation present.  Cardiovascular: Normal rate, regular rhythm, normal heart sounds and intact distal pulses.  Pulmonary/Chest: Effort normal.  Tachypneic, diminished breath sounds bilaterally but equal rise and fall of chest.  Speaking in full sentences without difficulty  Abdominal: Soft. Bowel sounds are normal. He exhibits no distension. There is no tenderness. There is no guarding.  Musculoskeletal: He exhibits no edema.  Lymphadenopathy:    He has no cervical adenopathy.  Neurological: He is alert.  Skin: Skin is warm and dry. No erythema.  Psychiatric: He has a normal mood and affect. His behavior is normal.  Nursing note and vitals reviewed.    ED Treatments / Results  Labs (all labs ordered are listed, but only abnormal results are displayed) Labs Reviewed  BASIC METABOLIC PANEL - Abnormal; Notable for the following components:      Result Value   Glucose, Bld 112 (*)    BUN <5 (*)    All other components within normal limits  CBC - Abnormal; Notable for the following components:   WBC 16.5 (*)    Platelets 423 (*)    All other components within normal limits  URINALYSIS, ROUTINE W REFLEX MICROSCOPIC - Abnormal; Notable for the following components:   Ketones, ur 20 (*)    Protein, ur 30 (*)    All other components within normal limits  I-STAT TROPONIN, ED    EKG EKG Interpretation  Date/Time:  Monday October 20 2017 11:04:06 EDT Ventricular Rate:  101 PR Interval:  154 QRS Duration: 112 QT Interval:  354 QTC Calculation: 459 R Axis:   53 Text Interpretation:  Sinus tachycardia Right atrial enlargement Borderline ECG No previous ECGs available Confirmed by Alvira Monday ([id number]) on 10/20/2017 2:25:01 PM   Radiology No results found.  Procedures Procedures (including critical care  time)  Medications Ordered in ED Medications  lisinopril (PRINIVIL,ZESTRIL) tablet 20 mg (20 mg Oral Given 10/20/17 1349)  amLODipine (NORVASC) tablet 10 mg (10 mg Oral Given 10/20/17 1349)  albuterol (PROVENTIL) (2.5 MG/3ML) 0.083% nebulizer solution 2.5 mg (2.5 mg Nebulization Given 10/20/17 1512)  albuterol (PROVENTIL HFA;VENTOLIN HFA) 108 (90 Base) MCG/ACT inhaler 1 puff (1 puff Inhalation Given 10/20/17 1640)     Initial Impression / Assessment and Plan / ED Course  I have reviewed the triage vital signs and the nursing notes.  Pertinent labs & imaging results that were available during my care of the patient were reviewed by me and considered in my medical decision making (see chart for details).     Patient with 2-day history of fever, myalgias, and productive cough.  Chest pain experienced only with cough, not pleuritic or exertional.  Does not sound cardiac in etiology.  Afebrile in the ED although has been taking Tylenol.  He is mildly tachypneic and markedly hypertensive and states he has not been taking any hypertension medicines for over a year due to not having access to a primary care physician.  SPO2 saturations stable and he does not exhibit any significantly increased work of breathing.  Will obtain chest x-ray, lab work, EKG, and UA for further evaluation of his symptoms and hypertension.  Lab work shows leukocytosis, no anemia.  No metabolic derangements.  Creatinine within normal limits.  UA shows findings consistent with dehydration, less suggestive of UTI or nephrolithiasis.  EKG shows mild sinus tachycardia, possible right atrial enlargement, no ischemic changes noted.  Consistent with hypertension.  Troponin ordered at triage is negative, I do not see a need to obtain a second troponin as I do not feel that his symptoms are cardiac in etiology.  Patient ambulated in the hallway, SPO2 saturations stable with no increased work of breathing.  Doubt PE.  Patient mildly tachycardic  after albuterol nebulizer but does note that chest congestion has improved and feels that he is breathing better.  Symptoms consistent with lower respiratory infection, possible pneumonia.  Chest x-ray does not show any sign of any acute cardiopulmonary abnormality however patient states that he does feel he may be dehydrated which could impair visibility of a pneumonia on a chest x-ray.  He is a smoker and with symptoms of fever and productive cough, feel it is reasonable to treat him for community-acquired pneumonia with azithromycin.  Doubt PE, dissection, tamponade, esophageal rupture, or other acute life-threatening cardiopulmonary pathology.  He was given p.o. antihypertensive medications he has been prescribed in the past with mild improvement in his blood pressure.  No evidence of endorgan damage and he is otherwise asymptomatic.  Will discharge home with month supply of the antihypertensives he was prescribed previously.  He was given information for [facility] for follow-up.  Emphasized the importance of gaining control of his hypertension.  Discussed strict ED return precautions.  Patient and patient's visitor verbalized understanding of and agreement with plan and patient is stable for discharge home at this time.  Discussed with Dr. Dalene Seltzer who agrees with assessment and plan at this time.  Final Clinical Impressions(s) / ED Diagnoses   Final diagnoses:  Cough  SOB (shortness of breath)  Hypertension, unspecified type    ED Discharge Orders         Ordered    amLODipine (NORVASC) 10 MG tablet  Daily     10/20/17 1616    lisinopril (PRINIVIL,ZESTRIL) 20 MG tablet  Daily     10/20/17 1616    azithromycin (ZITHROMAX) 250 MG tablet  Daily     10/20/17 1616    predniSONE (DELTASONE) 10 MG tablet  Daily with breakfast     10/20/17 1616           Malynn Lucy, [place] A, PA-C 10/22/17 1827    Alvira Monday, MD 10/24/17 [phone]

## 2018-12-10 ENCOUNTER — Other Ambulatory Visit: Payer: Self-pay

## 2018-12-10 DIAGNOSIS — Z20822 Contact with and (suspected) exposure to covid-19: Secondary | ICD-10-CM

## 2018-12-13 LAB — NOVEL CORONAVIRUS, NAA: SARS-CoV-2, NAA: NOT DETECTED

## 2019-10-17 IMAGING — DX DG CHEST 2V
2 series · 2 of 2 positions shown · non-contrast
Comparison: Chest radiograph 12/27/2012

CLINICAL DATA: Cough for 2 days.  Congestion.

EXAM:
CHEST - 2 VIEW

[chest pa]
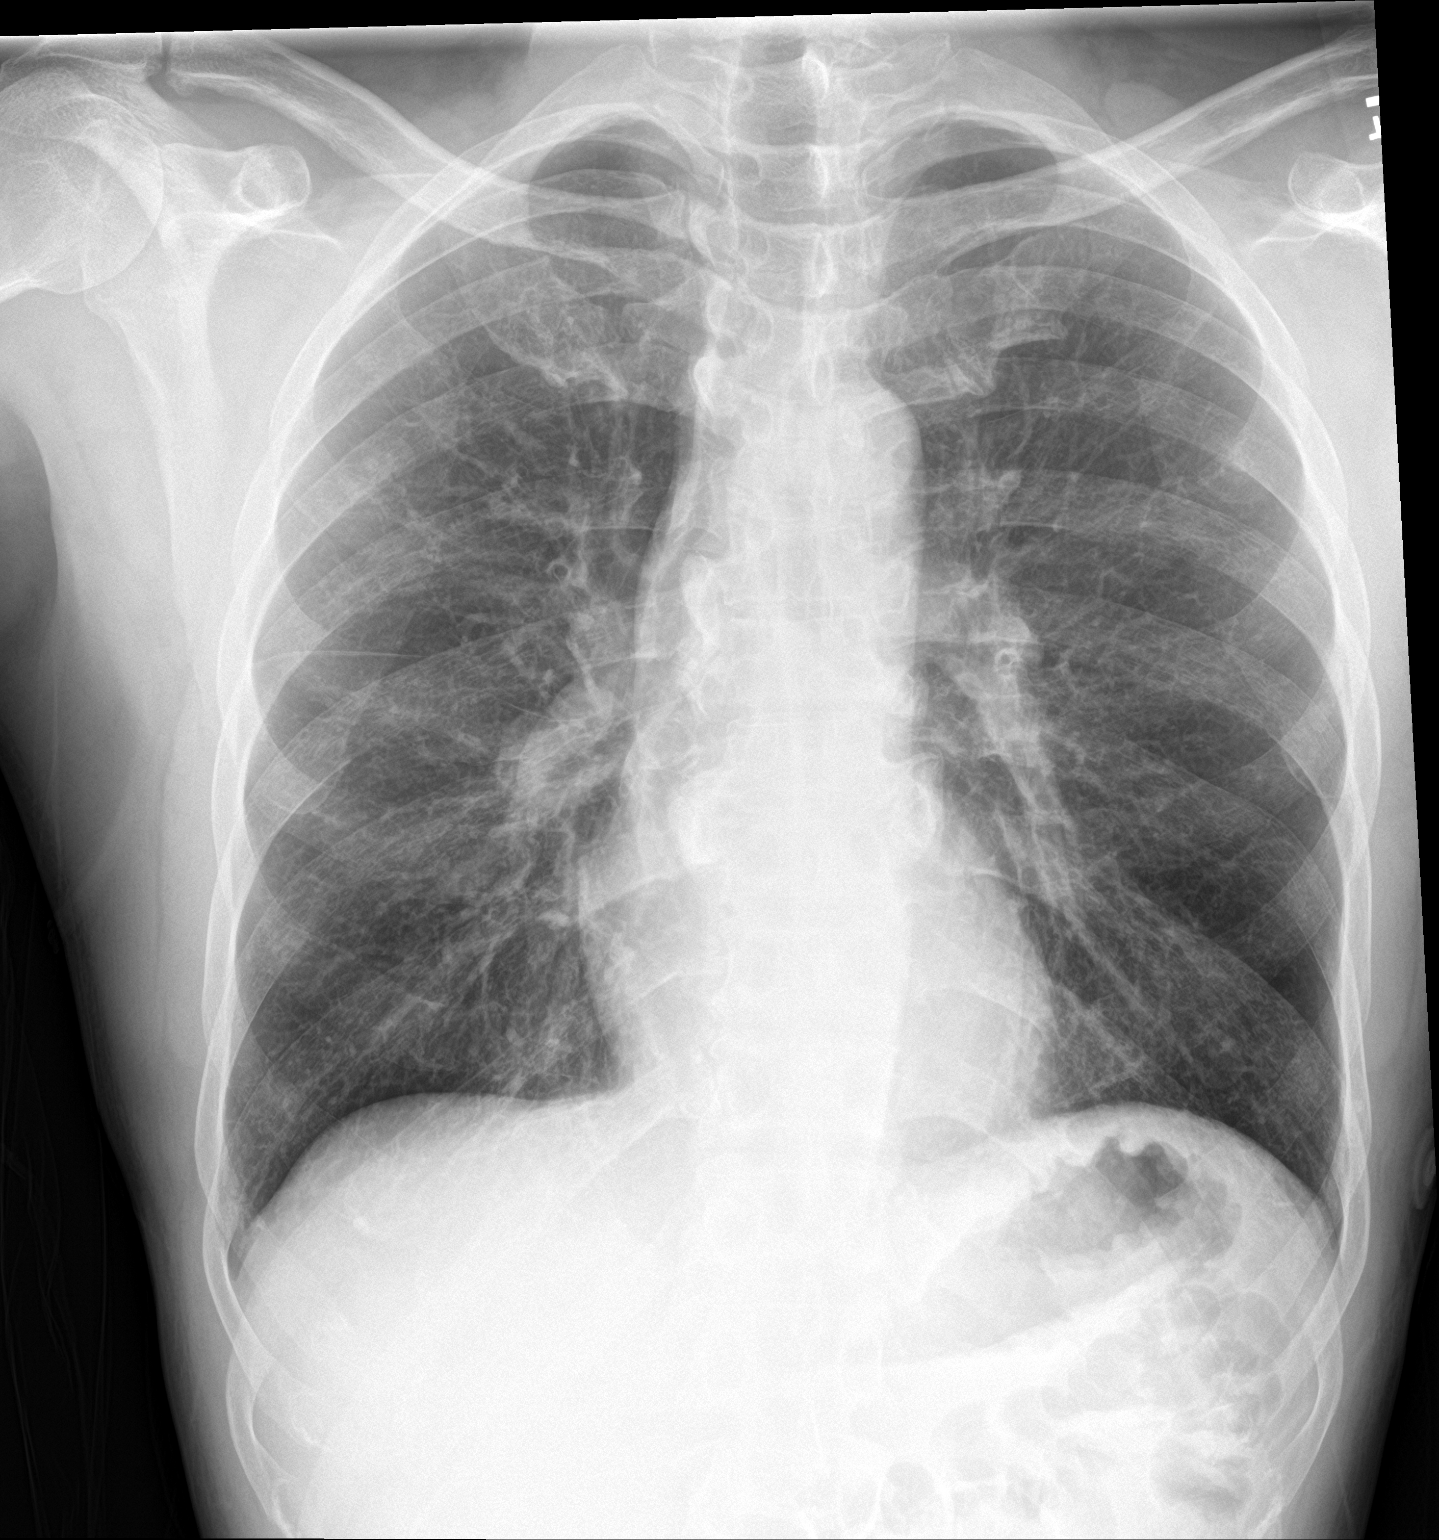

[chest lat]
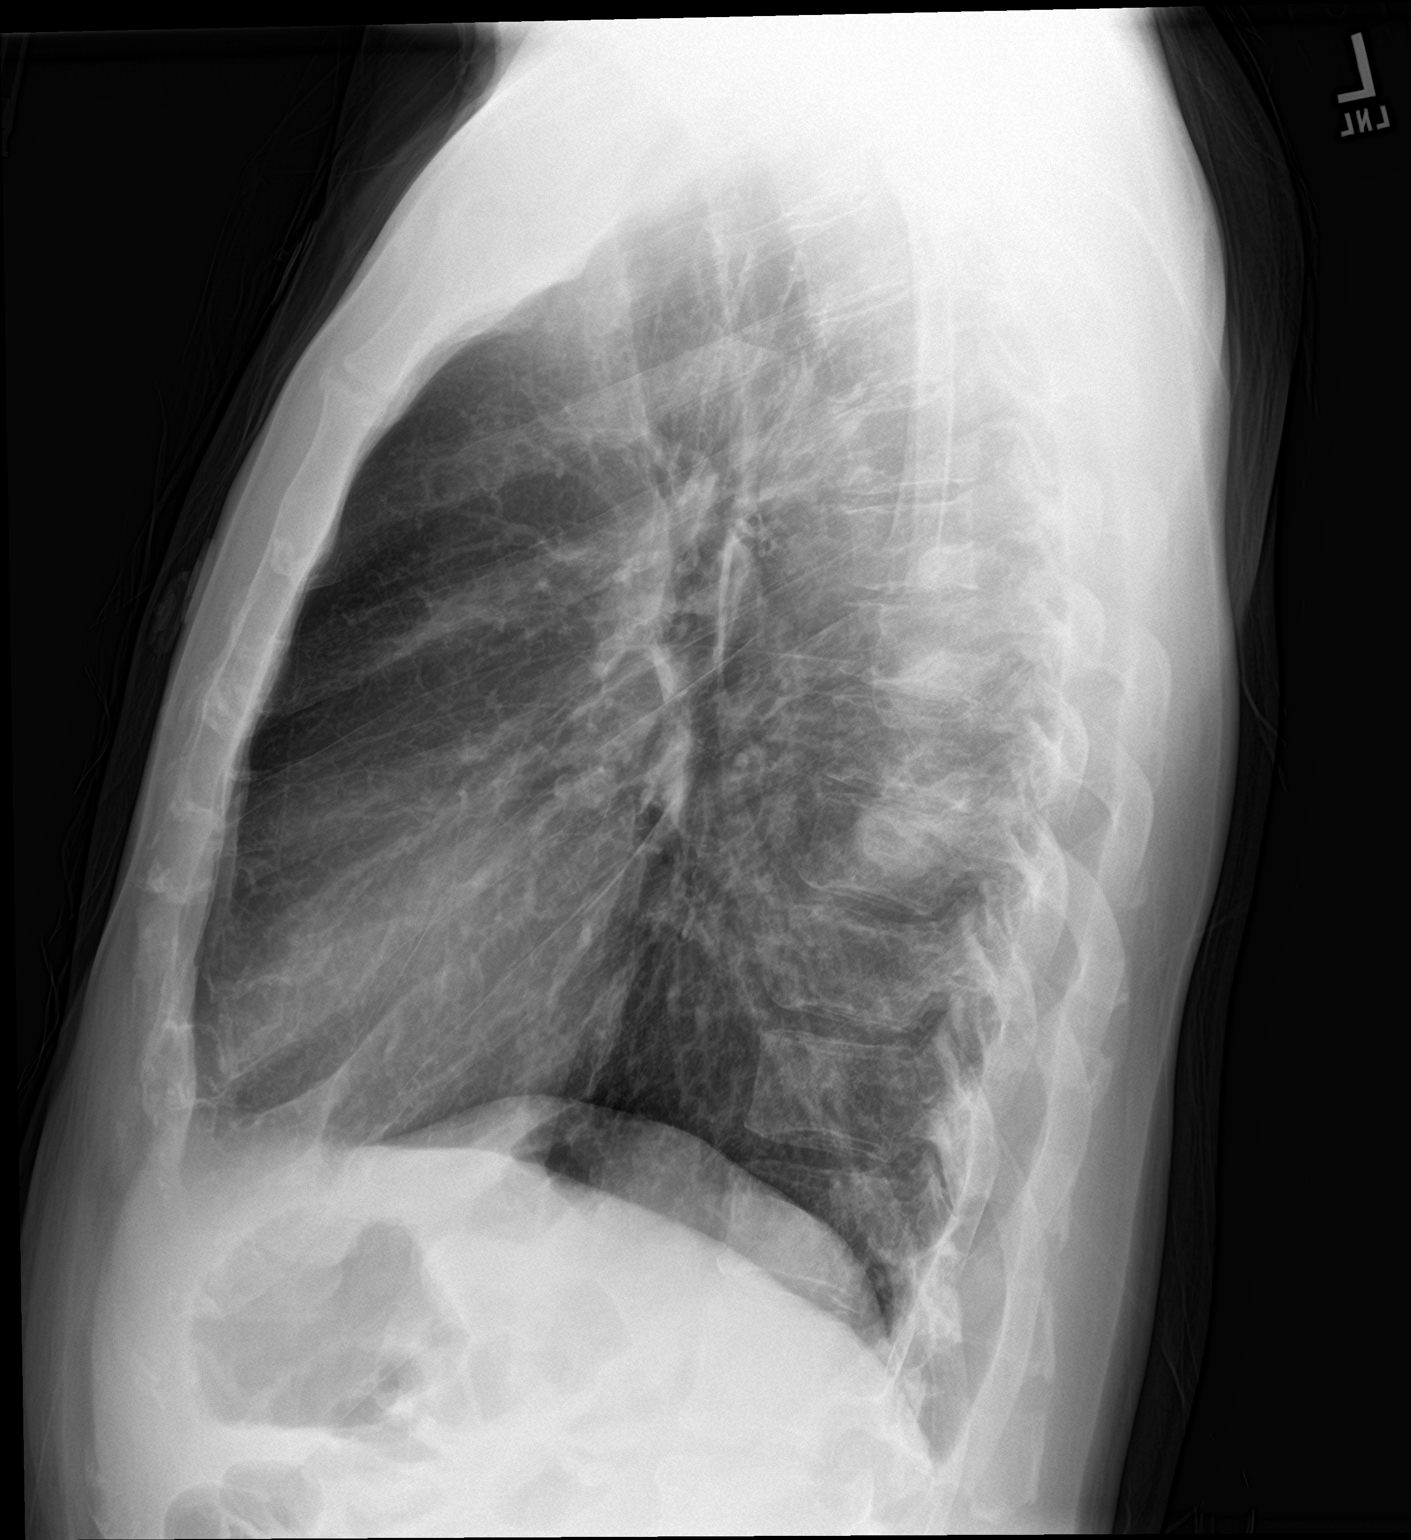

[2 of 2 positions shown; findings below may reference images not displayed]

FINDINGS: Stable cardiac and mediastinal contours. No consolidative pulmonary
opacities. No pleural effusion or pneumothorax. Thoracic spine
degenerative changes.
IMPRESSION: No active cardiopulmonary disease.
